# Patient Record
Sex: Female | Born: 1935 | Race: Black or African American | Hispanic: No | Smoking: Former smoker
Health system: Southern US, Community
[De-identification: ages and names within clinical notes are randomized; demographics above are authoritative.]

## PROBLEM LIST (undated history)

## (undated) DIAGNOSIS — I25810 Atherosclerosis of coronary artery bypass graft(s) without angina pectoris: Secondary | ICD-10-CM

## (undated) DIAGNOSIS — I359 Nonrheumatic aortic valve disorder, unspecified: Secondary | ICD-10-CM

## (undated) DIAGNOSIS — M1990 Unspecified osteoarthritis, unspecified site: Secondary | ICD-10-CM

## (undated) DIAGNOSIS — I10 Essential (primary) hypertension: Secondary | ICD-10-CM

## (undated) DIAGNOSIS — E785 Hyperlipidemia, unspecified: Secondary | ICD-10-CM

## (undated) DIAGNOSIS — J449 Chronic obstructive pulmonary disease, unspecified: Secondary | ICD-10-CM

## (undated) DIAGNOSIS — E119 Type 2 diabetes mellitus without complications: Secondary | ICD-10-CM

## (undated) HISTORY — PX: CORONARY ARTERY BYPASS GRAFT: SHX141

## (undated) HISTORY — DX: Atherosclerosis of coronary artery bypass graft(s) without angina pectoris: I25.810

## (undated) HISTORY — DX: Nonrheumatic aortic valve disorder, unspecified: I35.9

## (undated) HISTORY — DX: Chronic obstructive pulmonary disease, unspecified: J44.9

## (undated) HISTORY — DX: Essential (primary) hypertension: I10

## (undated) HISTORY — DX: Unspecified osteoarthritis, unspecified site: M19.90

## (undated) HISTORY — DX: Type 2 diabetes mellitus without complications: E11.9

## (undated) HISTORY — DX: Hyperlipidemia, unspecified: E78.5

---

## 1998-03-10 ENCOUNTER — Emergency Department (HOSPITAL_COMMUNITY): Admission: EM | Admit: 1998-03-10 | Discharge: 1998-03-10 | Payer: Self-pay | Admitting: Emergency Medicine

## 1998-03-10 ENCOUNTER — Encounter: Payer: Self-pay | Admitting: Emergency Medicine

## 1999-04-07 ENCOUNTER — Ambulatory Visit (HOSPITAL_COMMUNITY): Admission: RE | Admit: 1999-04-07 | Discharge: 1999-04-07 | Payer: Self-pay | Admitting: *Deleted

## 1999-04-07 ENCOUNTER — Encounter: Payer: Self-pay | Admitting: *Deleted

## 1999-04-09 ENCOUNTER — Inpatient Hospital Stay (HOSPITAL_COMMUNITY): Admission: AD | Admit: 1999-04-09 | Discharge: 1999-04-12 | Payer: Self-pay | Admitting: *Deleted

## 1999-04-10 ENCOUNTER — Encounter: Payer: Self-pay | Admitting: *Deleted

## 1999-10-08 ENCOUNTER — Encounter: Admission: RE | Admit: 1999-10-08 | Discharge: 2000-01-06 | Payer: Self-pay

## 1999-10-16 ENCOUNTER — Other Ambulatory Visit: Admission: RE | Admit: 1999-10-16 | Discharge: 1999-10-16 | Payer: Self-pay | Admitting: Obstetrics

## 2000-06-22 ENCOUNTER — Encounter: Payer: Self-pay | Admitting: *Deleted

## 2000-06-22 ENCOUNTER — Ambulatory Visit (HOSPITAL_COMMUNITY): Admission: RE | Admit: 2000-06-22 | Discharge: 2000-06-22 | Payer: Self-pay | Admitting: *Deleted

## 2000-08-06 ENCOUNTER — Ambulatory Visit (HOSPITAL_COMMUNITY): Admission: RE | Admit: 2000-08-06 | Discharge: 2000-08-06 | Payer: Self-pay | Admitting: Cardiology

## 2000-08-16 ENCOUNTER — Encounter: Payer: Self-pay | Admitting: Cardiology

## 2000-08-16 ENCOUNTER — Ambulatory Visit (HOSPITAL_COMMUNITY): Admission: RE | Admit: 2000-08-16 | Discharge: 2000-08-16 | Payer: Self-pay | Admitting: Cardiology

## 2000-08-25 ENCOUNTER — Ambulatory Visit (HOSPITAL_COMMUNITY): Admission: RE | Admit: 2000-08-25 | Discharge: 2000-08-25 | Payer: Self-pay | Admitting: *Deleted

## 2000-08-25 ENCOUNTER — Encounter: Payer: Self-pay | Admitting: *Deleted

## 2000-08-26 ENCOUNTER — Encounter: Payer: Self-pay | Admitting: *Deleted

## 2000-08-26 ENCOUNTER — Ambulatory Visit (HOSPITAL_COMMUNITY): Admission: RE | Admit: 2000-08-26 | Discharge: 2000-08-26 | Payer: Self-pay | Admitting: *Deleted

## 2000-09-07 ENCOUNTER — Encounter (HOSPITAL_COMMUNITY): Admission: RE | Admit: 2000-09-07 | Discharge: 2000-12-06 | Payer: Self-pay | Admitting: *Deleted

## 2000-10-05 ENCOUNTER — Ambulatory Visit (HOSPITAL_COMMUNITY): Admission: RE | Admit: 2000-10-05 | Discharge: 2000-10-05 | Payer: Self-pay | Admitting: Cardiology

## 2001-04-20 ENCOUNTER — Encounter: Payer: Self-pay | Admitting: *Deleted

## 2001-04-20 ENCOUNTER — Ambulatory Visit (HOSPITAL_COMMUNITY): Admission: RE | Admit: 2001-04-20 | Discharge: 2001-04-20 | Payer: Self-pay | Admitting: *Deleted

## 2001-04-26 ENCOUNTER — Encounter (HOSPITAL_BASED_OUTPATIENT_CLINIC_OR_DEPARTMENT_OTHER): Admission: RE | Admit: 2001-04-26 | Discharge: 2001-05-02 | Payer: Self-pay | Admitting: Orthopedic Surgery

## 2001-07-27 ENCOUNTER — Encounter (HOSPITAL_COMMUNITY): Admission: RE | Admit: 2001-07-27 | Discharge: 2001-10-25 | Payer: Self-pay | Admitting: *Deleted

## 2002-01-17 ENCOUNTER — Encounter: Admission: RE | Admit: 2002-01-17 | Discharge: 2002-04-17 | Payer: Self-pay | Admitting: Internal Medicine

## 2002-04-24 ENCOUNTER — Ambulatory Visit (HOSPITAL_COMMUNITY): Admission: RE | Admit: 2002-04-24 | Discharge: 2002-04-24 | Payer: Self-pay | Admitting: Orthopedic Surgery

## 2002-04-24 ENCOUNTER — Encounter: Payer: Self-pay | Admitting: Orthopedic Surgery

## 2002-10-04 ENCOUNTER — Encounter: Payer: Self-pay | Admitting: *Deleted

## 2002-10-04 ENCOUNTER — Ambulatory Visit (HOSPITAL_COMMUNITY): Admission: RE | Admit: 2002-10-04 | Discharge: 2002-10-04 | Payer: Self-pay | Admitting: *Deleted

## 2002-10-17 ENCOUNTER — Encounter (HOSPITAL_COMMUNITY): Admission: RE | Admit: 2002-10-17 | Discharge: 2003-01-15 | Payer: Self-pay | Admitting: *Deleted

## 2003-05-04 ENCOUNTER — Ambulatory Visit (HOSPITAL_COMMUNITY): Admission: RE | Admit: 2003-05-04 | Discharge: 2003-05-04 | Payer: Self-pay | Admitting: *Deleted

## 2004-11-19 ENCOUNTER — Ambulatory Visit: Payer: Self-pay | Admitting: *Deleted

## 2004-11-28 ENCOUNTER — Ambulatory Visit: Payer: Self-pay | Admitting: Emergency Medicine

## 2004-12-01 ENCOUNTER — Ambulatory Visit: Payer: Self-pay

## 2004-12-01 ENCOUNTER — Encounter: Payer: Self-pay | Admitting: Internal Medicine

## 2005-01-28 ENCOUNTER — Ambulatory Visit: Payer: Self-pay | Admitting: Emergency Medicine

## 2005-04-06 ENCOUNTER — Emergency Department (HOSPITAL_COMMUNITY): Admission: EM | Admit: 2005-04-06 | Discharge: 2005-04-06 | Payer: Self-pay | Admitting: Emergency Medicine

## 2005-05-07 ENCOUNTER — Emergency Department (HOSPITAL_COMMUNITY): Admission: EM | Admit: 2005-05-07 | Discharge: 2005-05-07 | Payer: Self-pay | Admitting: Emergency Medicine

## 2005-07-16 ENCOUNTER — Ambulatory Visit: Payer: Self-pay | Admitting: *Deleted

## 2005-11-11 ENCOUNTER — Ambulatory Visit: Payer: Self-pay | Admitting: Cardiology

## 2005-11-11 ENCOUNTER — Observation Stay (HOSPITAL_COMMUNITY): Admission: EM | Admit: 2005-11-11 | Discharge: 2005-11-12 | Payer: Self-pay | Admitting: Emergency Medicine

## 2005-11-23 ENCOUNTER — Encounter: Payer: Self-pay | Admitting: Internal Medicine

## 2005-11-23 ENCOUNTER — Ambulatory Visit: Payer: Self-pay

## 2005-11-23 ENCOUNTER — Ambulatory Visit: Payer: Self-pay | Admitting: Internal Medicine

## 2006-03-02 ENCOUNTER — Ambulatory Visit: Payer: Self-pay | Admitting: *Deleted

## 2006-05-04 ENCOUNTER — Ambulatory Visit: Payer: Self-pay

## 2006-07-08 ENCOUNTER — Emergency Department (HOSPITAL_COMMUNITY): Admission: EM | Admit: 2006-07-08 | Discharge: 2006-07-08 | Payer: Self-pay | Admitting: Family Medicine

## 2006-11-09 ENCOUNTER — Ambulatory Visit: Payer: Self-pay | Admitting: Cardiovascular Disease

## 2007-07-18 ENCOUNTER — Ambulatory Visit: Payer: Self-pay | Admitting: Cardiovascular Disease

## 2008-01-24 ENCOUNTER — Emergency Department (HOSPITAL_COMMUNITY): Admission: EM | Admit: 2008-01-24 | Discharge: 2008-01-24 | Payer: Self-pay | Admitting: Emergency Medicine

## 2008-01-31 ENCOUNTER — Ambulatory Visit: Payer: Self-pay | Admitting: Cardiovascular Disease

## 2008-09-29 ENCOUNTER — Emergency Department (HOSPITAL_COMMUNITY): Admission: EM | Admit: 2008-09-29 | Discharge: 2008-09-30 | Payer: Self-pay | Admitting: Emergency Medicine

## 2009-02-27 DIAGNOSIS — J449 Chronic obstructive pulmonary disease, unspecified: Secondary | ICD-10-CM | POA: Insufficient documentation

## 2009-02-27 DIAGNOSIS — E119 Type 2 diabetes mellitus without complications: Secondary | ICD-10-CM | POA: Insufficient documentation

## 2009-02-27 DIAGNOSIS — M1990 Unspecified osteoarthritis, unspecified site: Secondary | ICD-10-CM | POA: Insufficient documentation

## 2009-02-27 DIAGNOSIS — E785 Hyperlipidemia, unspecified: Secondary | ICD-10-CM | POA: Insufficient documentation

## 2009-02-27 DIAGNOSIS — J4489 Other specified chronic obstructive pulmonary disease: Secondary | ICD-10-CM | POA: Insufficient documentation

## 2009-02-27 DIAGNOSIS — I10 Essential (primary) hypertension: Secondary | ICD-10-CM | POA: Insufficient documentation

## 2009-02-27 DIAGNOSIS — I359 Nonrheumatic aortic valve disorder, unspecified: Secondary | ICD-10-CM | POA: Insufficient documentation

## 2009-02-27 DIAGNOSIS — I25810 Atherosclerosis of coronary artery bypass graft(s) without angina pectoris: Secondary | ICD-10-CM

## 2009-04-18 ENCOUNTER — Ambulatory Visit: Payer: Self-pay | Admitting: Cardiovascular Disease

## 2009-06-28 ENCOUNTER — Inpatient Hospital Stay (HOSPITAL_COMMUNITY): Admission: EM | Admit: 2009-06-28 | Discharge: 2009-07-03 | Payer: Self-pay | Admitting: Cardiology

## 2009-06-28 ENCOUNTER — Ambulatory Visit: Payer: Self-pay | Admitting: Cardiology

## 2009-07-01 ENCOUNTER — Encounter: Payer: Self-pay | Admitting: Cardiology

## 2009-07-02 ENCOUNTER — Encounter: Payer: Self-pay | Admitting: Cardiology

## 2009-07-04 ENCOUNTER — Encounter: Payer: Self-pay | Admitting: Internal Medicine

## 2009-07-04 ENCOUNTER — Telehealth: Payer: Self-pay | Admitting: Cardiovascular Disease

## 2009-07-15 ENCOUNTER — Encounter: Payer: Self-pay | Admitting: Cardiology

## 2009-07-15 ENCOUNTER — Ambulatory Visit: Payer: Self-pay

## 2009-07-25 ENCOUNTER — Ambulatory Visit: Payer: Self-pay | Admitting: Internal Medicine

## 2009-07-25 DIAGNOSIS — I442 Atrioventricular block, complete: Secondary | ICD-10-CM

## 2009-07-25 DIAGNOSIS — I5032 Chronic diastolic (congestive) heart failure: Secondary | ICD-10-CM

## 2009-07-25 DIAGNOSIS — N259 Disorder resulting from impaired renal tubular function, unspecified: Secondary | ICD-10-CM | POA: Insufficient documentation

## 2009-07-26 LAB — CONVERTED CEMR LAB
Chloride: 104 meq/L (ref 96–112)
Creatinine, Ser: 2.1 mg/dL — ABNORMAL HIGH (ref 0.4–1.2)
Glucose, Bld: 142 mg/dL — ABNORMAL HIGH (ref 70–99)
Sodium: 142 meq/L (ref 135–145)

## 2009-10-07 ENCOUNTER — Ambulatory Visit: Payer: Self-pay | Admitting: Cardiovascular Disease

## 2009-10-18 ENCOUNTER — Ambulatory Visit: Payer: Self-pay | Admitting: Cardiology

## 2009-10-18 DIAGNOSIS — Z950 Presence of cardiac pacemaker: Secondary | ICD-10-CM | POA: Insufficient documentation

## 2009-11-15 ENCOUNTER — Encounter (INDEPENDENT_AMBULATORY_CARE_PROVIDER_SITE_OTHER): Payer: Self-pay | Admitting: *Deleted

## 2009-12-30 ENCOUNTER — Telehealth: Payer: Self-pay | Admitting: Cardiology

## 2009-12-30 ENCOUNTER — Observation Stay (HOSPITAL_COMMUNITY)
Admission: EM | Admit: 2009-12-30 | Discharge: 2009-12-31 | Payer: Self-pay | Source: Home / Self Care | Attending: Cardiology | Admitting: Cardiology

## 2010-01-09 ENCOUNTER — Telehealth (INDEPENDENT_AMBULATORY_CARE_PROVIDER_SITE_OTHER): Payer: Self-pay | Admitting: Radiology

## 2010-01-14 ENCOUNTER — Ambulatory Visit: Payer: Self-pay

## 2010-01-14 ENCOUNTER — Ambulatory Visit (HOSPITAL_COMMUNITY): Admission: RE | Admit: 2010-01-14 | Payer: Self-pay | Source: Home / Self Care | Admitting: Cardiovascular Disease

## 2010-01-14 ENCOUNTER — Encounter (HOSPITAL_COMMUNITY): Admission: RE | Admit: 2010-01-14 | Payer: Self-pay | Source: Home / Self Care | Admitting: Obstetrics and Gynecology

## 2010-01-22 ENCOUNTER — Telehealth (INDEPENDENT_AMBULATORY_CARE_PROVIDER_SITE_OTHER): Payer: Self-pay

## 2010-01-23 ENCOUNTER — Encounter: Payer: Self-pay | Admitting: Internal Medicine

## 2010-01-23 ENCOUNTER — Ambulatory Visit (HOSPITAL_COMMUNITY)
Admission: RE | Admit: 2010-01-23 | Discharge: 2010-01-23 | Payer: Self-pay | Source: Home / Self Care | Attending: Cardiovascular Disease | Admitting: Cardiovascular Disease

## 2010-01-23 ENCOUNTER — Other Ambulatory Visit: Payer: Self-pay | Admitting: Cardiovascular Disease

## 2010-01-23 ENCOUNTER — Encounter (HOSPITAL_COMMUNITY)
Admission: RE | Admit: 2010-01-23 | Discharge: 2010-02-18 | Payer: Self-pay | Source: Home / Self Care | Attending: Cardiovascular Disease | Admitting: Cardiovascular Disease

## 2010-02-14 ENCOUNTER — Ambulatory Visit
Admission: RE | Admit: 2010-02-14 | Discharge: 2010-02-14 | Payer: Self-pay | Source: Home / Self Care | Attending: Cardiovascular Disease | Admitting: Cardiovascular Disease

## 2010-02-20 NOTE — Assessment & Plan Note (Signed)
Summary: per check out/sf   Visit Type:  3 mo f/u Primary Provider:  Dr Nathanial Rancher  CC:  chest pain at times...sob...denies any edema.  History of Present Illness:  [name] is 77 years old and return for management of her pacemaker and CAD. Her primary cardiologist is Dr. Excell Seltzer and I am seeing her back in pacemaker followup today. In 1998 she had bypass surgery. In June of 2011 she was admitted with weakness and complete heart block and underwent implantation of a DDD pacemaker.  She has done well since that time. She's had no recent chest pain or palpitations. She does have chronic shortness of breath related to her COPD. Her other problems include diabetes, diastolic CHF, and renal insufficiency with creatinines in the range of 2.3. She also has COPD.  She also has echo evidence of a hyperdynamic LV with a subaortic valve gradient of 55 by echo in 2007 and pulmonary hypertension.  Current Medications (verified): 1)  Amlodipine Besylate 10 Mg Tabs (Amlodipine Besylate) .Marland Kitchen.. 1 By Mouth Daily 2)  Protonix 40 Mg Tbec (Pantoprazole Sodium) .Marland Kitchen.. 1 By Mouth Daily 3)  Simvastatin 20 Mg Tabs (Simvastatin) .... Take One Tablet By Mouth Daily At Bedtime 4)  Aspirin 81 Mg  Tabs (Aspirin) .Marland Kitchen.. 1 By Mouth Daily 5)  Nitrostat 0.4 Mg Subl (Nitroglycerin) .Marland Kitchen.. 1 Tablet Under Tongue At Onset of Chest Pain; You May Repeat Every 5 Minutes For Up To 3 Doses. 6)  Novolog 100 Unit/ml Soln (Insulin Aspart) .... As Directed 7)  Ferrous Sulfate 325 (65 Fe) Mg  Tabs (Ferrous Sulfate) .Marland Kitchen.. 1 Tab By Mouth Two Times A Day 8)  Furosemide 20 Mg Tabs (Furosemide) .... Take 1 Tablet By Mouth Once Daily 9)  Metoprolol Tartrate 25 Mg Tabs (Metoprolol Tartrate) .... Take 1 Tablet By Mouth Two Times A Day 10)  Ventolin Hfa 108 (90 Base) Mcg/act Aers (Albuterol Sulfate) .Marland Kitchen.. 1 Puff Q 4 Hours As Needed  Allergies: 1)  ! Penicillin  Past History:  Past Medical History: Reviewed history from 02/27/2009 and no changes  required. Current Problems:  CAD, AUTOLOGOUS BYPASS GRAFT (ICD-414.02) AORTIC STENOSIS, MILD (ICD-424.1)- with subvalvular outflow tract obstruction HYPERTENSION, UNSPECIFIED (ICD-401.9) DYSLIPIDEMIA (ICD-272.4) DIABETES MELLITUS, TYPE II (ICD-250.00) DEGENERATIVE JOINT DISEASE (ICD-715.90) COPD (ICD-496)  Review of Systems       ROS is negative except as outlined in HPI.   Vital Signs:  Patient profile:   77 year old female Height:      60 inches Weight:      138 pounds BMI:     27.05 Pulse rate:   87 / minute Pulse rhythm:   regular BP sitting:   156 / 62  (left arm) Cuff size:   regular  Vitals Entered By: Danielle Rankin, CMA (October 18, 2009 10:45 AM)  Physical Exam  Additional Exam:  Gen. Well-nourished, in no distress   Neck: No JVD, thyroid not enlarged, no carotid bruits Lungs: No tachypnea, clear without rales, rhonchi or wheezes Cardiovascular: Rhythm regular, PMI not displaced,  heart sounds  normal, grade 2/6 systolic ejection murmur at the left sternal edge, no peripheral edema, pulses normal in all 4 extremities. Abdomen: BS normal, abdomen soft and non-tender without masses or organomegaly, no hepatosplenomegaly. MS: No deformities, no cyanosis or clubbing   Neuro:  No focal sns   Skin:  no lesions    PPM Specifications Following MD:  Everardo Beals. Juanda Chance, MD     PPM Vendor:  Medtronic  PPM Model Number:  [id number]     PPM Serial Number:  [id number] PPM DOI:  07/03/2009     PPM Implanting MD:  Lewayne Bunting, MD  Lead 1    Location: RA     DOI: 07/03/2009     Model #: [id number]     Serial #: [id number]     Status: active Lead 2    Location: RV     DOI: 07/03/2009     Model #: [id number]     Serial #: [id number]     Status: active  Magnet Response Rate:  BOL 85 ERI  65  Indications:  CHB   PPM Follow Up Battery Voltage:  2.80 V     Battery Est. Longevity:  9 yrs       PPM Device Measurements Atrium  Amplitude: 4.00 mV, Impedance: 524 ohms, Threshold: 1.00 V at 0.40  msec Right Ventricle  Amplitude: 11.20 mV, Impedance: 589 ohms, Threshold: 0.50 V at 0.40 msec  Episodes MS Episodes:  0     Percent Mode Switch:  0%     Ventricular High Rate:  1     Atrial Pacing:  3.8%     Ventricular Pacing:  99.9%  Parameters Mode:  DDDR     Lower Rate Limit:  60     Upper Rate Limit:  130 Paced AV Delay:  150     Sensed AV Delay:  120 Next Cardiology Appt Due:  06/23/2010 Tech Comments:  1 VHR EPISODE LASTING 6 SECONDS.  NORMAL DEVICE FUNCTION.  CHANGED RA OUTPUT FROM 3.5 TO 2.00 AND RV OUTPUT FROM 3.5 TO 2.50 V. ROV IN JUNE 2012 W/GT. Vella Kohler  October 18, 2009 11:29 AM  Impression & Recommendations:  Problem # 1:  PACEMAKER (ICD-V45.Marland Kitchen01) She had a DDD pacemaker implanted in June for complete AV block. We will interrogate her pacemaker today. We'll arrange followup in pacemaker clinic with Dr. Ladona Ridgel in one year.  Problem # 2:  CAD, AUTOLOGOUS BYPASS GRAFT (ICD-414.02)  She had previous bypass surgery. She's had no recent chest pain and this problem appears stable. We'll arrange followup with Dr. Excell Seltzer who is her primary cardiologist in 6 months. Her updated medication list for this problem includes:    Amlodipine Besylate 10 Mg Tabs (Amlodipine besylate) .Marland Kitchen... 1 by mouth daily    Aspirin 81 Mg Tabs (Aspirin) .Marland Kitchen... 1 by mouth daily    Nitrostat 0.4 Mg Subl (Nitroglycerin) .Marland Kitchen... 1 tablet under tongue at onset of chest pain; you may repeat every 5 minutes for up to 3 doses.    Metoprolol Tartrate 25 Mg Tabs (Metoprolol tartrate) .Marland Kitchen... Take 1 tablet by mouth two times a day  Orders: EKG w/ Interpretation (93000)  Problem # 3:  HOCM / IHSS (ICD-425.1) in 2007 she had an echocardiogram which showed mild LVH and cavity obliteration with a subvalvular gradient of about 55 mmHg and pulmonary hypertension with an estimated systolic pressure of 68 mm. She appears euvolemic today and is probably appears stable. Her updated medication list for this problem  includes:    Amlodipine Besylate 10 Mg Tabs (Amlodipine besylate) .Marland Kitchen... 1 by mouth daily    Aspirin 81 Mg Tabs (Aspirin) .Marland Kitchen... 1 by mouth daily    Nitrostat 0.4 Mg Subl (Nitroglycerin) .Marland Kitchen... 1 tablet under tongue at onset of chest pain; you may repeat every 5 minutes for up to 3 doses.    Furosemide 20 Mg Tabs (Furosemide) .Marland Kitchen... Take 1 tablet by mouth once daily  Metoprolol Tartrate 25 Mg Tabs (Metoprolol tartrate) .Marland Kitchen... Take 1 tablet by mouth two times a day  Patient Instructions: 1)  Your physician recommends that you schedule a follow-up appointment in: 6 months with Dr. Excell Seltzer   1 year with Dr.  Ladona Ridgel 2)  Your physician recommends that you continue on your current medications as directed. Please refer to the Current Medication list given to you today. Prescriptions: NITROSTAT 0.4 MG SUBL (NITROGLYCERIN) 1 tablet under tongue at onset of chest pain; you may repeat every 5 minutes for up to 3 doses.  #25 x 6   Entered by:   Danielle Rankin, CMA   Authorized by:   Lenoria Farrier, MD, [facility]   Signed by:   Danielle Rankin, CMA on 10/18/2009   Method used:   Faxed to ...       [facility] (retail)       [address]       [place], [place]  [address]       Ph: [phone] or [phone]       Fax: [phone]   RxID:   [id number]

## 2010-02-20 NOTE — Assessment & Plan Note (Signed)
Summary: eph/jml   Visit Type:  Follow-up  CC:  no complaints.  History of Present Illness: This is an 77 year old African American female patient who went to the hospital with some progressive weakness and was found to be in complete heart block with a ventricular rate in the 40s a right bundle branch block. She was also noted to have acute on chronic renal failure with a creatinine of 2.8.  The patient underwent successful implantation of a Medtronic dual-chamber pacemaker July 01, 2009. She also had diastolic heart failure and was diuresed. Reviewing an echo from 2007 showed a hyperdynamic LV function with cavity obliteration of 55 mm subvalvular gradient. She was placed on beta blockers. Creatinine was down to 2.29 at discharge.  Patient denies chest pain, palpitations, dyspnea at rest, orthopnea, dizziness, or presyncope. She does have asthma and chronic dyspnea on exertion which is treated with inhalers.  Current Medications (verified): 1)  Amlodipine Besylate 10 Mg Tabs (Amlodipine Besylate) .Marland Kitchen.. 1 By Mouth Daily 2)  Protonix 40 Mg Tbec (Pantoprazole Sodium) .Marland Kitchen.. 1 By Mouth Daily 3)  Simvastatin 20 Mg Tabs (Simvastatin) .... Take One Tablet By Mouth Daily At Bedtime 4)  Aspirin 81 Mg  Tabs (Aspirin) .Marland Kitchen.. 1 By Mouth Daily 5)  Nitrostat 0.4 Mg Subl (Nitroglycerin) .Marland Kitchen.. 1 By Mouth Daily 6)  Novolog 100 Unit/ml Soln (Insulin Aspart) .... As Directed 7)  Tandem Plus 162-115.2-1 Mg Caps (Fefum-Fepo-Fa-B Cmp-C-Zn-Mn-Cu) .... Take 1 Capsule By Mouth Daily 8)  Furosemide 20 Mg Tabs (Furosemide) .... Take 1 Tablet By Mouth Once Daily 9)  Metoprolol Tartrate 25 Mg Tabs (Metoprolol Tartrate) .... Take 1 Tablet By Mouth Two Times A Day 10)  Albuterol Inhaler .Marland Kitchen.. 1 Puff Every 6 Hrs As Needed  Allergies: 1)  ! Penicillin  Past History:  Past Medical History: Last updated: 02/27/2009 Current Problems:  CAD, AUTOLOGOUS BYPASS GRAFT (ICD-414.02) AORTIC STENOSIS, MILD (ICD-424.1)- with  subvalvular outflow tract obstruction HYPERTENSION, UNSPECIFIED (ICD-401.9) DYSLIPIDEMIA (ICD-272.4) DIABETES MELLITUS, TYPE II (ICD-250.00) DEGENERATIVE JOINT DISEASE (ICD-715.90) COPD (ICD-496)  Review of Systems       see history of present illness  Vital Signs:  Patient profile:   77 year old female Height:      60 inches Weight:      138 pounds BMI:     27.05 Pulse rate:   88 / minute Pulse rhythm:   regular BP sitting:   162 / 82  (right arm)  Vitals Entered By: Jacquelin Hawking, CMA (July 25, 2009 9:48 AM)  Serial Vital Signs/Assessments:  Time      Position  BP       Pulse  Resp  Temp     By                     130/70                         Marletta Lor, PA-C   Physical Exam  General:   Well-nournished, in no acute distress. Neck: No JVD, HJR, Bruit, or thyroid enlargement Lungs: No tachypnea, clear without wheezing, rales, or rhonchi Cardiovascular: Pacer site healing well no hematoma or hemorrhage,RRR, PMI not displaced, heart sounds normal,2/6 systolic murmur at the left sternal border,no gallops, bruit, thrill, or heave. Abdomen: BS normal. Soft without organomegaly, masses, lesions or tenderness. Extremities: without cyanosis, clubbing or edema. Good distal pulses bilateral SKin: Warm, no lesions or rashes  Musculoskeletal: No deformities Neuro: no  focal signs    PPM Specifications Following MD:  Everardo Beals. Juanda Chance, MD     PPM Vendor:  Medtronic     PPM Model Number:  [id number]     PPM Serial Number:  [id number] PPM DOI:  07/03/2009     PPM Implanting MD:  Lewayne Bunting, MD  Lead 1    Location: RA     DOI: 07/03/2009     Model #: [id number]     Serial #: [id number]     Status: active Lead 2    Location: RV     DOI: 07/03/2009     Model #: [id number]     Serial #: [id number]     Status: active  Magnet Response Rate:  BOL 85 ERI  65  Indications:  CHB   Parameters Mode:  DDDR     Lower Rate Limit:  60     Upper Rate Limit:  130 Paced AV Delay:  150      Sensed AV Delay:  120  Impression & Recommendations:  Problem # 1:  AV BLOCK, COMPLETE (ICD-426.0) Patient presented to the hospital and complete heart block and underwent Medtronic permanent pacer placement July 01, 2009. She is doing well without complaints. Her updated medication list for this problem includes:    Amlodipine Besylate 10 Mg Tabs (Amlodipine besylate) .Marland Kitchen... 1 by mouth daily    Aspirin 81 Mg Tabs (Aspirin) .Marland Kitchen... 1 by mouth daily    Nitrostat 0.4 Mg Subl (Nitroglycerin) .Marland Kitchen... 1 by mouth daily    Metoprolol Tartrate 25 Mg Tabs (Metoprolol tartrate) .Marland Kitchen... Take 1 tablet by mouth two times a day  Problem # 2:  DIASTOLIC HEART FAILURE, CHRONIC (ICD-428.32) Patient had acute on chronic heart failure in the hospital and diuresed easily. Last 2-D echo was in 2007. We may want to repeat this in the future. Her updated medication list for this problem includes:    Amlodipine Besylate 10 Mg Tabs (Amlodipine besylate) .Marland Kitchen... 1 by mouth daily    Aspirin 81 Mg Tabs (Aspirin) .Marland Kitchen... 1 by mouth daily    Nitrostat 0.4 Mg Subl (Nitroglycerin) .Marland Kitchen... 1 by mouth daily    Furosemide 20 Mg Tabs (Furosemide) .Marland Kitchen... Take 1 tablet by mouth once daily    Metoprolol Tartrate 25 Mg Tabs (Metoprolol tartrate) .Marland Kitchen... Take 1 tablet by mouth two times a day  Problem # 3:  RENAL INSUFFICIENCY (ICD-588.9) Patient had acute on chronic renal failure in the hospital. Creatinine went up to 2.8. It was 2.29 at discharge. We will repeat today.  Problem # 4:  HYPERTENSION, UNSPECIFIED (ICD-401.9) Patient has a history of white coat syndrome. Her blood pressure came down nicely on repeat check. Her updated medication list for this problem includes:    Amlodipine Besylate 10 Mg Tabs (Amlodipine besylate) .Marland Kitchen... 1 by mouth daily    Aspirin 81 Mg Tabs (Aspirin) .Marland Kitchen... 1 by mouth daily    Furosemide 20 Mg Tabs (Furosemide) .Marland Kitchen... Take 1 tablet by mouth once daily    Metoprolol Tartrate 25 Mg Tabs (Metoprolol tartrate) .Marland Kitchen...  Take 1 tablet by mouth two times a day  Orders: TLB-BMP (Basic Metabolic Panel-BMET) (80048-METABOL)  Patient Instructions: 1)  Your physician recommends that you schedule a follow-up appointment in: 1 to 2 months with Dr. Excell Seltzer 2)  Your physician recommends that you return for lab work in: BMET today.

## 2010-02-20 NOTE — Miscellaneous (Signed)
Summary: dx code correction  Clinical Lists Changes  Problems: Changed problem from PACEMAKER (ICD-V45.Marland Kitchen01) to PACEMAKER, PERMANENT (ICD-V45.01)  changed the incorrect dx code to correct dx code Genella Mech  November 15, 2009 11:00 AM

## 2010-02-20 NOTE — Progress Notes (Signed)
Summary: chest pain  Phone Note Call from Patient Call back at Home Phone [phone]   Caller: Patient Reason for Call: Talk to Nurse Summary of Call: pt has chest pain. pt states she has taken meds but it has not help.  Initial call taken by: Roe Coombs,  December 30, 2009 8:35 AM  Follow-up for Phone Call        12/30/09--0830am--pt's care giver(MS TENNANT)  calling stating pt is having chest pain across left chest--unable to give me pain scale reading, but has taken 2 Ntg without relief--advised to call 911 and proceed to [place]--caregiver agrees--attempted to notify trish and paperwork faxed to [place]-- Follow-up by: Ledon Snare, RN,  December 30, 2009 8:41 AM     Appended Document: chest pain Dr. Excell Seltzer is this pt's primary cardiologist. Dr. Juanda Chance sees for PPM.

## 2010-02-20 NOTE — Assessment & Plan Note (Signed)
Summary: 2 month rov   Visit Type:  Follow-up Primary Provider:  Dr Nathanial Rancher  CC:  sob.  History of Present Illness: This is an 77 year old African American female patient who was hospitalized earlier this year with progressive weakness and was found to be in complete heart block with a ventricular rate in the 40s a right bundle branch block. She was also noted to have acute on chronic renal failure with a creatinine of 2.8.  The patient underwent successful implantation of a Medtronic dual-chamber pacemaker July 01, 2009. She also had diastolic heart failure and was diuresed. Reviewing an echo from 2007 showed a hyperdynamic LV function with cavity obliteration of 55 mm subvalvular gradient. She was placed on beta blockers. Creatinine was down to 2.29 at discharge.  She has chronic dyspnea unchanged over several years. No orthopnea, PND, or edema. No chest pain. Denies lightheadedness or syncope.  Current Medications (verified): 1)  Amlodipine Besylate 10 Mg Tabs (Amlodipine Besylate) .Marland Kitchen.. 1 By Mouth Daily 2)  Protonix 40 Mg Tbec (Pantoprazole Sodium) .Marland Kitchen.. 1 By Mouth Daily 3)  Simvastatin 20 Mg Tabs (Simvastatin) .... Take One Tablet By Mouth Daily At Bedtime 4)  Aspirin 81 Mg  Tabs (Aspirin) .Marland Kitchen.. 1 By Mouth Daily 5)  Nitrostat 0.4 Mg Subl (Nitroglycerin) .Marland Kitchen.. 1 By Mouth Daily 6)  Novolog 100 Unit/ml Soln (Insulin Aspart) .... As Directed 7)  Ferrous Sulfate 325 (65 Fe) Mg  Tabs (Ferrous Sulfate) .Marland Kitchen.. 1 Tab By Mouth Two Times A Day 8)  Furosemide 20 Mg Tabs (Furosemide) .... Take 1 Tablet By Mouth Once Daily 9)  Metoprolol Tartrate 25 Mg Tabs (Metoprolol Tartrate) .... Take 1 Tablet By Mouth Two Times A Day 10)  Albuterol Inhaler .Marland Kitchen.. 1 Puff Every 6 Hrs As Needed  Allergies: 1)  ! Penicillin  Past History:  Past medical history reviewed for relevance to current acute and chronic problems.  Past Medical History: Reviewed history from 02/27/2009 and no changes required. Current  Problems:  CAD, AUTOLOGOUS BYPASS GRAFT (ICD-414.02) AORTIC STENOSIS, MILD (ICD-424.1)- with subvalvular outflow tract obstruction HYPERTENSION, UNSPECIFIED (ICD-401.9) DYSLIPIDEMIA (ICD-272.4) DIABETES MELLITUS, TYPE II (ICD-250.00) DEGENERATIVE JOINT DISEASE (ICD-715.90) COPD (ICD-496)  Review of Systems       Negative except as per HPI   Vital Signs:  Patient profile:   77 year old female Height:      60 inches Weight:      139 pounds Pulse rate:   89 / minute Resp:     20 per minute BP sitting:   153 / 74  (left arm)  Vitals Entered By: Kem Parkinson (October 07, 2009 10:22 AM)  Serial Vital Signs/Assessments:  Time      Position  BP       Pulse  Resp  Temp     By           R Arm     153/74                         Kimalexis Barnes           L Arm     150/69                         Kimalexis Barnes   Physical Exam  General:  Pt is alert and oriented, elderly woman in no acute distress. HEENT: normal Neck: normal carotid upstrokes without bruits, JVP normal Lungs: CTA CV: RRR with  3/6 systolic murmur along LSB Abd: soft, NT, positive BS, no bruit, no organomegaly Ext: no clubbing, cyanosis, or edema. peripheral pulses 2+ and equal Skin: warm and dry without rash    PPM Specifications Following MD:  Everardo Beals. Juanda Chance, MD     PPM Vendor:  Medtronic     PPM Model Number:  [id number]     PPM Serial Number:  [id number] PPM DOI:  07/03/2009     PPM Implanting MD:  Lewayne Bunting, MD  Lead 1    Location: RA     DOI: 07/03/2009     Model #: 9147     Serial #: [id number]     Status: active Lead 2    Location: RV     DOI: 07/03/2009     Model #: [id number]     Serial #: [id number]     Status: active  Magnet Response Rate:  BOL 85 ERI  65  Indications:  CHB   Parameters Mode:  DDDR     Lower Rate Limit:  60     Upper Rate Limit:  130 Paced AV Delay:  150     Sensed AV Delay:  120  Impression & Recommendations:  Problem # 1:  AV BLOCK, COMPLETE (ICD-426.0) Now s/p  permanent pacemaker with resolution of weakness.  Her updated medication list for this problem includes:    Amlodipine Besylate 10 Mg Tabs (Amlodipine besylate) .Marland Kitchen... 1 by mouth daily    Aspirin 81 Mg Tabs (Aspirin) .Marland Kitchen... 1 by mouth daily    Nitrostat 0.4 Mg Subl (Nitroglycerin) .Marland Kitchen... 1 by mouth daily    Metoprolol Tartrate 25 Mg Tabs (Metoprolol tartrate) .Marland Kitchen... Take 1 tablet by mouth two times a day  Problem # 2:  CAD, AUTOLOGOUS BYPASS GRAFT (ICD-414.02) Stable without angina. Continue current medical therapy.  Her updated medication list for this problem includes:    Amlodipine Besylate 10 Mg Tabs (Amlodipine besylate) .Marland Kitchen... 1 by mouth daily    Aspirin 81 Mg Tabs (Aspirin) .Marland Kitchen... 1 by mouth daily    Nitrostat 0.4 Mg Subl (Nitroglycerin) .Marland Kitchen... 1 by mouth daily    Metoprolol Tartrate 25 Mg Tabs (Metoprolol tartrate) .Marland Kitchen... Take 1 tablet by mouth two times a day  Problem # 3:  DIASTOLIC HEART FAILURE, CHRONIC (ICD-428.32) Volume status is stable. Exertional dyspnea unchanged over many years. Appears euvolemic on exam.  Her updated medication list for this problem includes:    Amlodipine Besylate 10 Mg Tabs (Amlodipine besylate) .Marland Kitchen... 1 by mouth daily    Aspirin 81 Mg Tabs (Aspirin) .Marland Kitchen... 1 by mouth daily    Nitrostat 0.4 Mg Subl (Nitroglycerin) .Marland Kitchen... 1 by mouth daily    Furosemide 20 Mg Tabs (Furosemide) .Marland Kitchen... Take 1 tablet by mouth once daily    Metoprolol Tartrate 25 Mg Tabs (Metoprolol tartrate) .Marland Kitchen... Take 1 tablet by mouth two times a day  Patient Instructions: 1)  Your physician recommends that you continue on your current medications as directed. Please refer to the Current Medication list given to you today. 2)  Your physician wants you to follow-up in:  6 MONTHS.  You will receive a reminder letter in the mail two months in advance. If you don't receive a letter, please call our office to schedule the follow-up appointment.

## 2010-02-20 NOTE — Assessment & Plan Note (Signed)
Summary: Cardiology Nuclear Testing**echo**  Nuclear Med Background Indications for Stress Test: Evaluation for Ischemia, Graft Patency, Post Hospital  Indications Comments: 12/30/09 chest pain, (-) enzymes  History: CABG, COPD, Echo, Emphysema, Heart Catheterization, Myocardial Perfusion Study, Pacemaker  History Comments: '98 CABG; '02 Cath:patent grafts; 11/07 Echo:EF=75-80%,  mild AS;  Symptoms: Chest Pain, DOE, SOB  Symptoms Comments: Last episode of ZO:XWRU since d/c.   Nuclear Pre-Procedure Cardiac Risk Factors: History of Smoking, Hypertension, IDDM Type 2, RBBB Caffeine/Decaff Intake: None NPO After: 6:00 PM Lungs: Albuterol inhaler used at check-in, 11:45 a.m.  Lungs are clear.  O2 Sats 94% on RA. IV 0.9% NS with Angio Cath: 22g     IV Site: R Hand IV Started by: Bonnita Levan, RN Chest Size (in) 36     Cup Size D     Height (in): 60 Weight (lb): 136 BMI: 26.66 Tech Comments: Lopressor held this a.m.  Neb tx. this a.m.  Nuclear Med Study 1 or 2 day study:  1 day     Stress Test Type:  Eugenie Birks Reading MD:  Dietrich Pates, MD     Referring MD:  Tonny Bollman, MD Resting Radionuclide:  Technetium 64m Tetrofosmin     Resting Radionuclide Dose:  11 mCi  Stress Radionuclide:  Technetium 43m Tetrofosmin     Stress Radionuclide Dose:  33 mCi   Stress Protocol   Lexiscan: 0.4 mg   Stress Test Technologist:  Rea [employer], CMA-N     Nuclear Technologist:  Doyne Keel, CNMT  Rest Procedure  Myocardial perfusion imaging was performed at rest 45 minutes following the intravenous administration of Technetium 52m Tetrofosmin.  Stress Procedure  The patient received IV Lexiscan 0.4 mg over 15-seconds.  Technetium 23m Tetrofosmin injected at 30-seconds.  There were no significant changes with infusion.  She did c/o chest tightness.  Quantitative spect images were obtained after a 45 minute delay.  QPS Raw Data Images:  Images were motion corrected.  SOft tissue (diaphragm)  underlies heart. Stress Images:  Normal homogeneous uptake in all areas of the myocardium. Rest Images:  Normal homogeneous uptake in all areas of the myocardium. Subtraction (SDS):  No evidence of ischemia. Transient Ischemic Dilatation:  1.04  (Normal <1.22)  Lung/Heart Ratio:  .28  (Normal <0.45)  Quantitative Gated Spect Images QGS EDV:  48 ml QGS ESV:  14 ml QGS EF:  71 %   Overall Impression  Exercise Capacity: Lexiscan with no exercise. BP Response: Normal blood pressure response. Clinical Symptoms: No chest pain ECG Impression: No significant ST segment change suggestive of ischemia. Overall Impression: Normal stress nuclear study.  Appended Document: Cardiology Nuclear Testing**echo** PT AWARE./CY

## 2010-02-20 NOTE — Progress Notes (Signed)
Summary: nuc pre-procedure  Phone Note Outgoing Call   Call placed by: Domenic Polite, CNMT,  January 09, 2010 2:33 PM Call placed to: Patient Reason for Call: Confirm/change Appt Summary of Call: Reviewed information on Myoview Information Sheet (see scanned document for further details).  Spoke with patient.      Nuclear Med Background Indications for Stress Test: Evaluation for Ischemia, Graft Patency, Post Hospital  Indications Comments: 12/30/09 Post Hosp Chest pain (-) enzymes  History: CABG, COPD, Echo, Heart Catheterization, Myocardial Perfusion Study, Pacemaker  History Comments: '98 cabg x 4; '02 cath severe 3v dz/ patent grafts; 11/07 Echo EF= 75-80% mild  AS ; 6/11 pacer ; Pulm. HTN / CHF     Nuclear Pre-Procedure Cardiac Risk Factors: History of Smoking, Hypertension Height (in): 60

## 2010-02-20 NOTE — Cardiovascular Report (Signed)
Summary: [employer] Implant Record   [employer] Implant Record   Imported By: Roderic Ovens 07/19/2009 09:35:19  _____________________________________________________________________  External Attachment:    Type:   Image     Comment:   External Document

## 2010-02-20 NOTE — Progress Notes (Signed)
Summary: Nuc. Pre-Procedure  Phone Note Outgoing Call Call back at [facility] Phone [phone]   Call placed by: Irean Hong, RN,  January 22, 2010 3:42 PM Summary of Call: Reviewed information on Myoview Information Sheet (see scanned document for further details).  Spoke with patient.     Nuclear Med Background Indications for Stress Test: Evaluation for Ischemia, Graft Patency, Post Hospital  Indications Comments: 12/30/09 Post Hosp Chest pain (-) enzymes  History: CABG, COPD, Echo, Heart Catheterization, Myocardial Perfusion Study, Pacemaker  History Comments: '98 CABG x 4; '02 cath severe 3v dz/ patent grafts; 11/07 Echo EF= 75-80% mild  AS ; 6/11 PTVP ; Pulm. HTN / CHF     Nuclear Pre-Procedure Cardiac Risk Factors: History of Smoking, Hypertension Height (in): 60

## 2010-02-20 NOTE — Cardiovascular Report (Signed)
Summary: Office Visit  Office Visit   Imported By: Marylou Mccoy 09/13/2009 09:35:10  _____________________________________________________________________  External Attachment:    Type:   Image     Comment:   External Document

## 2010-02-20 NOTE — Procedures (Signed)
Summary: wound check   Current Medications (verified): 1)  Amlodipine Besylate 10 Mg Tabs (Amlodipine Besylate) .Marland Kitchen.. 1 By Mouth Daily 2)  Protonix 40 Mg Tbec (Pantoprazole Sodium) .Marland Kitchen.. 1 By Mouth Daily 3)  Simvastatin 20 Mg Tabs (Simvastatin) .... Take One Tablet By Mouth Daily At Bedtime 4)  Aspirin 81 Mg  Tabs (Aspirin) .Marland Kitchen.. 1 By Mouth Daily 5)  Nitrostat 0.4 Mg Subl (Nitroglycerin) .Marland Kitchen.. 1 By Mouth Daily 6)  Novolog 100 Unit/ml Soln (Insulin Aspart) .... As Directed 7)  Tandem Plus 162-115.2-1 Mg Caps (Fefum-Fepo-Fa-B Cmp-C-Zn-Mn-Cu) .... Take 1 Capsule By Mouth Daily 8)  Furosemide 20 Mg Tabs (Furosemide) .... Take 1 Tablet By Mouth Once Daily 9)  Metoprolol Tartrate 25 Mg Tabs (Metoprolol Tartrate) .... Take 1 Tablet By Mouth Two Times A Day 10)  Albuterol Inhaler .Marland Kitchen.. 1 Puff Every 6 Hrs As Needed  Allergies: 1)  ! Penicillin  PPM Specifications Following MD:  Everardo Beals. Juanda Chance, MD     PPM Vendor:  [employer]     PPM Model Number:  [id number]     PPM Serial Number:  [id number] PPM DOI:  07/03/2009     PPM Implanting MD:  Lewayne Bunting, MD  Lead 1    Location: RA     DOI: 07/03/2009     Model #: [id number]     Serial #: [id number]     Status: active Lead 2    Location: RV     DOI: 07/03/2009     Model #: [id number]     Serial #: [id number]     Status: active  Magnet Response Rate:  BOL 85 ERI  65  Indications:  CHB   PPM Follow Up Battery Voltage:  2.80 V     Battery Est. Longevity:  9.5yrs       PPM Device Measurements Atrium  Amplitude: 5.60 mV, Impedance: 454 ohms, Threshold: 0.750 V at 0.40 msec Right Ventricle  Amplitude: 11.20 mV, Impedance: 678 ohms, Threshold: 0.50 V at 0.40 msec  Episodes MS Episodes:  0     Ventricular High Rate:  0     Atrial Pacing:  0.3%     Ventricular Pacing:  99.9%  Parameters Mode:  DDDR     Lower Rate Limit:  60     Upper Rate Limit:  130 Paced AV Delay:  150     Sensed AV Delay:  120 Next Cardiology Appt Due:  09/19/2009 Tech Comments:  WOUND  CHECK--STERI STRIPS REMOVED.  NO REDNESS OR SWELLING.  NORMAL DEVICE FUNCTION.  RA & RV OUTPUTS AT 3.5.  NO CHANGES MADE.  ROV IN 3 MTHS W/BB. Vella Kohler  July 15, 2009 11:13 AM   Patient Instructions: 1)  Followup appointment on 07-25-09 @ 1015 w/PA.  Next followup with Dr Juanda Chance in 3 mths.  Appointment to be made today.  Steri strips removed today and is ok to wash with soap and water.  No lotions or powder on site area.

## 2010-02-20 NOTE — Miscellaneous (Signed)
Summary: Device preload  Clinical Lists Changes  Observations: Added new observation of PPM INDICATN: CHB (07/04/2009 13:48) Added new observation of MAGNET RTE: BOL 85 ERI  65 (07/04/2009 13:48) Added new observation of PPMLEADSTAT2: active (07/04/2009 13:48) Added new observation of PPMLEADSER2: [id number] (07/04/2009 13:48) Added new observation of PPMLEADMOD2: 5076  (07/04/2009 13:48) Added new observation of PPMLEADLOC2: RV  (07/04/2009 13:48) Added new observation of PPMLEADSTAT1: active  (07/04/2009 13:48) Added new observation of PPMLEADSER1: [id number]  (07/04/2009 13:48) Added new observation of PPMLEADMOD1: 5076  (07/04/2009 13:48) Added new observation of PPMLEADLOC1: RA  (07/04/2009 13:48) Added new observation of PPM IMP MD: Lewayne Bunting, MD  (07/04/2009 13:48) Added new observation of PPMLEADDOI2: 07/03/2009  (07/04/2009 13:48) Added new observation of PPMLEADDOI1: 07/03/2009  (07/04/2009 13:48) Added new observation of PPM DOI: 07/03/2009  (07/04/2009 13:48) Added new observation of PPM SERL#: [id number]  (07/04/2009 13:48) Added new observation of PPM MODL#: [id number]  (07/04/2009 96:29) Added new observation of PACEMAKERMFG: [employer]  (07/04/2009 13:48) Added new observation of PACEMAKER MD: Lewayne Bunting, MD  (07/04/2009 13:48)      PPM Specifications Following MD:  Lewayne Bunting, MD     PPM Vendor:  [employer]     PPM Model Number:  [id number]     PPM Serial Number:  [id number] PPM DOI:  07/03/2009     PPM Implanting MD:  Lewayne Bunting, MD  Lead 1    Location: RA     DOI: 07/03/2009     Model #: [id number]     Serial #: [id number]     Status: active Lead 2    Location: RV     DOI: 07/03/2009     Model #: [id number]     Serial #: [id number]     Status: active  Magnet Response Rate:  BOL 85 ERI  65  Indications:  CHB

## 2010-02-20 NOTE — Assessment & Plan Note (Signed)
Summary: f1y  Medications Added RE DUALVIT PLUS 162-115.2-1 MG CAPS (FEFUM-FEPO-FA-B CMP-C-ZN-MN-CU) Take 1 capsule by mouth once a day LOSARTAN POTASSIUM-HCTZ 100-25 MG TABS (LOSARTAN POTASSIUM-HCTZ) Take 1 tablet by mouth once a day SIMVASTATIN 20 MG TABS (SIMVASTATIN) Take one tablet by mouth daily at bedtime NOVOLOG 100 UNIT/ML SOLN (INSULIN ASPART) as directed        Visit Type:  1 year follow up  CC:  Sob-.  History of Present Illness: Ms. Bas is an 77 year old woman with coronary artery disease and prior coronary artery bypass surgery.  She also has hypertension, dyslipidemia, and mild aortic stenosis with subvalvular outflow tract obstruction.   She presents today for follow-up evaluation. The pt admits to chronic DOE, but denies any recent change in this symptom. Dyspnea occurs with low-levael activity. No chest pain, edema, orthopnea, or PND.  Current Medications (verified): 1)  Amlodipine Besylate 10 Mg Tabs (Amlodipine Besylate) .Marland Kitchen.. 1 By Mouth Daily 2)  Re Dualvit Plus 162-115.2-1 Mg Caps (Fefum-Fepo-Fa-B Cmp-C-Zn-Mn-Cu) .... Take 1 Capsule By Mouth Once A Day 3)  Losartan Potassium-Hctz 100-25 Mg Tabs (Losartan Potassium-Hctz) .... Take 1 Tablet By Mouth Once A Day 4)  Protonix 40 Mg Tbec (Pantoprazole Sodium) .Marland Kitchen.. 1 By Mouth Daily 5)  Simvastatin 20 Mg Tabs (Simvastatin) .... Take One Tablet By Mouth Daily At Bedtime 6)  Aspirin 81 Mg  Tabs (Aspirin) .Marland Kitchen.. 1 By Mouth Daily 7)  Nitrostat 0.4 Mg Subl (Nitroglycerin) .Marland Kitchen.. 1 By Mouth Daily 8)  Novolog 100 Unit/ml Soln (Insulin Aspart) .... As Directed  Allergies: 1)  ! Ace Inhibitors 2)  ! Penicillin  Past History:  Past medical history reviewed for relevance to current acute and chronic problems.  Past Medical History: Reviewed history from 02/27/2009 and no changes required. Current Problems:  CAD, AUTOLOGOUS BYPASS GRAFT (ICD-414.02) AORTIC STENOSIS, MILD (ICD-424.1)- with subvalvular outflow tract  obstruction HYPERTENSION, UNSPECIFIED (ICD-401.9) DYSLIPIDEMIA (ICD-272.4) DIABETES MELLITUS, TYPE II (ICD-250.00) DEGENERATIVE JOINT DISEASE (ICD-715.90) COPD (ICD-496)  Vital Signs:  Patient profile:   77 year old female Height:      60 inches Weight:      143.75 pounds Pulse rate:   95 / minute Pulse rhythm:   regular Resp:     20 per minute BP sitting:   148 / 79  (left arm) Cuff size:   large  Vitals Entered By: Vikki Ports (April 18, 2009 10:44 AM)  Physical Exam  General:  Pt is alert and oriented, elderly, African-American woman, in no acute distress. HEENT: normal Neck: normal carotid upstrokes without bruits, JVP normal Lungs: CTA CV: RRR with 1/6 SEM along LSB Abd: soft, NT, positive BS, no bruit, no organomegaly Ext: no clubbing, cyanosis, or edema. peripheral pulses 2+ and equal Skin: warm and dry without rash    EKG  Procedure date:  04/18/2009  Findings:      NSR with RBBB, LAD, biatrial enlargement, HR 95 bpm.  Impression & Recommendations:  Problem # 1:  CAD, AUTOLOGOUS BYPASS GRAFT (ICD-414.02) Stable without angina. Continue current med Rx as below. Beta blocker was discontinued because of symptomatic bradycardia.   Her updated medication list for this problem includes:    Amlodipine Besylate 10 Mg Tabs (Amlodipine besylate) .Marland Kitchen... 1 by mouth daily    Aspirin 81 Mg Tabs (Aspirin) .Marland Kitchen... 1 by mouth daily    Nitrostat 0.4 Mg Subl (Nitroglycerin) .Marland Kitchen... 1 by mouth daily  Orders: EKG w/ Interpretation (93000)  Problem # 2:  AORTIC STENOSIS, MILD (ICD-424.1) Barely audible on  exam. Continue observation.  Her updated medication list for this problem includes:    Losartan Potassium-hctz 100-25 Mg Tabs (Losartan potassium-hctz) .Marland Kitchen... Take 1 tablet by mouth once a day    Nitrostat 0.4 Mg Subl (Nitroglycerin) .Marland Kitchen... 1 by mouth daily  Orders: EKG w/ Interpretation (93000)  Problem # 3:  HYPERTENSION, UNSPECIFIED (ICD-401.9)  Her updated  medication list for this problem includes:    Amlodipine Besylate 10 Mg Tabs (Amlodipine besylate) .Marland Kitchen... 1 by mouth daily    Losartan Potassium-hctz 100-25 Mg Tabs (Losartan potassium-hctz) .Marland Kitchen... Take 1 tablet by mouth once a day    Aspirin 81 Mg Tabs (Aspirin) .Marland Kitchen... 1 by mouth daily  Orders: EKG w/ Interpretation (93000)  BP today: 148/79  Patient Instructions: 1)  Your physician recommends that you continue on your current medications as directed. Please refer to the Current Medication list given to you today. 2)  Your physician wants you to follow-up in:   1 YEAR. You will receive a reminder letter in the mail two months in advance. If you don't receive a letter, please call our office to schedule the follow-up appointment.

## 2010-02-20 NOTE — Progress Notes (Signed)
Summary: QUESTION ABOUT PHYSICAL THERAPY  Phone Note Call from Patient Call back at Home Phone [phone] Call back at [phone]   Caller: Daughter/JAMIE Summary of Call: PT DAUGHTER CALLING WITH QUESTION ABOUT  PT HAVING A PHYSICAL THERAPY ON HER ARM Initial call taken by: Judie Grieve,  July 04, 2009 9:44 AM  Follow-up for Phone Call        [facility] Lisabeth Devoid RN [facility] Lisabeth Devoid RN  Left message to call back. Julieta Gutting, RN, BSN  July 08, 2009 4:04 PM

## 2010-02-20 NOTE — Assessment & Plan Note (Signed)
Summary: eph   Visit Type:  Post-hospital Primary Provider:  Dr Nathanial Rancher  CC:  Sob.  History of Present Illness:  [name] [name] is 77 years old and returns for followup after recent hospitalization for chest pain. She has a pacemaker and CAD.  In 1998 she had bypass surgery. In June of 2011 she was admitted with weakness and complete heart block and underwent implantation of a DDD pacemaker.  She was hospitalized with chest pain in December but only had a single episode of pain. Cardiac markers were negative and she was discharged home with an outpatient stress test arranged. The Myoview scan showed no ischemia.   She has had no further pain episodes. Reports stable dyspnea with exertion. No edema, lightheadedness, or palpitations. No other complaints.  Current Medications (verified): 1)  Amlodipine Besylate 10 Mg Tabs (Amlodipine Besylate) .Marland Kitchen.. 1 By Mouth Daily 2)  Protonix 40 Mg Tbec (Pantoprazole Sodium) .Marland Kitchen.. 1 By Mouth Daily 3)  Simvastatin 20 Mg Tabs (Simvastatin) .... Take One Tablet By Mouth Daily At Bedtime 4)  Aspirin 81 Mg  Tabs (Aspirin) .Marland Kitchen.. 1 By Mouth Daily 5)  Nitrostat 0.4 Mg Subl (Nitroglycerin) .Marland Kitchen.. 1 Tablet Under Tongue At Onset of Chest Pain; You May Repeat Every 5 Minutes For Up To 3 Doses. 6)  Novolog 100 Unit/ml Soln (Insulin Aspart) .... As Directed 7)  Ferrous Sulfate 325 (65 Fe) Mg  Tabs (Ferrous Sulfate) .Marland Kitchen.. 1 Tab By Mouth Two Times A Day 8)  Furosemide 20 Mg Tabs (Furosemide) .... Take 1 Tablet By Mouth Once Daily 9)  Metoprolol Tartrate 25 Mg Tabs (Metoprolol Tartrate) .... Take 1 Tablet By Mouth Two Times A Day 10)  Ventolin Hfa 108 (90 Base) Mcg/act Aers (Albuterol Sulfate) .Marland Kitchen.. 1 Puff Q 4 Hours As Needed  Allergies: 1)  ! Penicillin 2)  ! Ace Inhibitors  Past History:  Past medical history reviewed for relevance to current acute and chronic problems.  Past Medical History: Reviewed history from 02/27/2009 and no changes required. Current Problems:    CAD, AUTOLOGOUS BYPASS GRAFT (ICD-414.02) AORTIC STENOSIS, MILD (ICD-424.1)- with subvalvular outflow tract obstruction HYPERTENSION, UNSPECIFIED (ICD-401.9) DYSLIPIDEMIA (ICD-272.4) DIABETES MELLITUS, TYPE II (ICD-250.00) DEGENERATIVE JOINT DISEASE (ICD-715.90) COPD (ICD-496)  Review of Systems       Negative except as per HPI   Vital Signs:  Patient profile:   77 year old female Height:      60 inches Weight:      140 pounds BMI:     27.44 Pulse rate:   80 / minute Pulse rhythm:   regular Resp:     20 per minute BP sitting:   136 / 70  (left arm) Cuff size:   large  Vitals Entered By: Vikki Ports (February 14, 2010 9:52 AM)  Physical Exam  General:  Pt is alert and oriented, elderly woman in no acute distress. HEENT: normal Neck: normal carotid upstrokes without bruits, JVP normal Lungs: CTA CV: RRR with 3/6 systolic murmur along LSB Abd: soft, NT, positive BS, no bruit, no organomegaly Ext: no clubbing, cyanosis, or edema. peripheral pulses 2+ and equal Skin: warm and dry without rash    Echocardiogram  Procedure date:  01/23/2010  Findings:      Study Conclusions            - Left ventricle: The cavity size was normal. Wall thickness was       increased in a pattern of moderate LVH. Systolic function was  vigorous. The estimated ejection fraction was in the range of 65%       to 70%. Wall motion was normal; there were no regional wall motion       abnormalities. Doppler parameters are consistent with abnormal       left ventricular relaxation (grade 1 diastolic dysfunction).     - Aortic valve: AV is thickened, calcified. Question functionally       bicuspid. Peak and mean gradients through the valve are 52 and 33       mm Hg respectively consistent with moderate AS. Mild       regurgitation.     - Mitral valve: MV leaflefts are difficult to see well. Peak and       mean gradients through the valve are 30 and 16 mm Hg respectively.       MVA by  Pressure T1/2 is 2.6 cm2. Severe annular calcification.       Mild regurgitation.     - Left atrium: The atrium was moderately dilated.     - Pulmonary arteries: PA peak pressure: 55mm Hg (S).         Nuclear Study  Procedure date:  01/23/2010  Findings:      QPS  Raw Data Images:  Images were motion corrected.  SOft tissue (diaphragm) underlies heart. Stress Images:  Normal homogeneous uptake in all areas of the myocardium. Rest Images:  Normal homogeneous uptake in all areas of the myocardium. Subtraction (SDS):  No evidence of ischemia. Transient Ischemic Dilatation:  1.04  (Normal <1.22)  Lung/Heart Ratio:  .28  (Normal <0.45)  Quantitative Gated Spect Images  QGS EDV:  48 ml QGS ESV:  14 ml QGS EF:  71 %   Overall Impression   Exercise Capacity: Lexiscan with no exercise. BP Response: Normal blood pressure response. Clinical Symptoms: No chest pain ECG Impression: No significant ST segment change suggestive of ischemia. Overall Impression: Normal stress nuclear study.     PPM Specifications Following MD:  Everardo Beals. Juanda Chance, MD     PPM Vendor:  [employer]     PPM Model Number:  [id number]     PPM Serial Number:  [id number] PPM DOI:  07/03/2009     PPM Implanting MD:  Lewayne Bunting, MD  Lead 1    Location: RA     DOI: 07/03/2009     Model #: [id number]     Serial #: [id number]     Status: active Lead 2    Location: RV     DOI: 07/03/2009     Model #: [id number]     Serial #: [id number]     Status: active  Magnet Response Rate:  BOL 85 ERI  65  Indications:  CHB   Parameters Mode:  DDDR     Lower Rate Limit:  60     Upper Rate Limit:  130 Paced AV Delay:  150     Sensed AV Delay:  120  Impression & Recommendations:  Problem # 1:  CAD, AUTOLOGOUS BYPASS GRAFT (ICD-414.02) Pt is stable without further chest pain. Nuclear study was reviewed and showed normal findings. Will continue current medical therapy.  Her updated medication list for this problem includes:    Amlodipine  Besylate 10 Mg Tabs (Amlodipine besylate) .Marland Kitchen... 1 by mouth daily    Aspirin 81 Mg Tabs (Aspirin) .Marland Kitchen... 1 by mouth daily    Nitrostat 0.4 Mg Subl (Nitroglycerin) .Marland Kitchen... 1 tablet under tongue at onset of chest pain; you  may repeat every 5 minutes for up to 3 doses.    Metoprolol Tartrate 25 Mg Tabs (Metoprolol tartrate) .Marland Kitchen... Take 1 tablet by mouth two times a day  Problem # 2:  HOCM / IHSS (ICD-425.1) Echo reviewed and question of valvular aortic stenosis versus subaortic stenosis. Velocities are in the moderate range is this is valvular AS. Will continue observation, maintain adequate hydration.  Her updated medication list for this problem includes:    Amlodipine Besylate 10 Mg Tabs (Amlodipine besylate) .Marland Kitchen... 1 by mouth daily    Aspirin 81 Mg Tabs (Aspirin) .Marland Kitchen... 1 by mouth daily    Nitrostat 0.4 Mg Subl (Nitroglycerin) .Marland Kitchen... 1 tablet under tongue at onset of chest pain; you may repeat every 5 minutes for up to 3 doses.    Furosemide 20 Mg Tabs (Furosemide) .Marland Kitchen... Take 1 tablet by mouth once daily    Metoprolol Tartrate 25 Mg Tabs (Metoprolol tartrate) .Marland Kitchen... Take 1 tablet by mouth two times a day  Problem # 3:  HYPERTENSION, UNSPECIFIED (ICD-401.9) BP controlled.  Her updated medication list for this problem includes:    Amlodipine Besylate 10 Mg Tabs (Amlodipine besylate) .Marland Kitchen... 1 by mouth daily    Aspirin 81 Mg Tabs (Aspirin) .Marland Kitchen... 1 by mouth daily    Furosemide 20 Mg Tabs (Furosemide) .Marland Kitchen... Take 1 tablet by mouth once daily    Metoprolol Tartrate 25 Mg Tabs (Metoprolol tartrate) .Marland Kitchen... Take 1 tablet by mouth two times a day  BP today: 136/70 Prior BP: 156/62 (10/18/2009)  Labs Reviewed: K+: 4.9 (07/25/2009) Creat: : 2.1 (07/25/2009)     Patient Instructions: 1)  Your physician recommends that you continue on your current medications as directed. Please refer to the Current Medication list given to you today. 2)  Your physician wants you to follow-up in: 1 YEAR.  You will receive a  reminder letter in the mail two months in advance. If you don't receive a letter, please call our office to schedule the follow-up appointment.

## 2010-03-21 ENCOUNTER — Emergency Department (HOSPITAL_COMMUNITY): Payer: PRIVATE HEALTH INSURANCE

## 2010-03-21 ENCOUNTER — Emergency Department (HOSPITAL_COMMUNITY)
Admission: EM | Admit: 2010-03-21 | Discharge: 2010-03-21 | Disposition: A | Discharge status: Home / Self Care | Payer: PRIVATE HEALTH INSURANCE | Attending: Emergency Medicine | Admitting: Emergency Medicine

## 2010-03-21 DIAGNOSIS — E119 Type 2 diabetes mellitus without complications: Secondary | ICD-10-CM | POA: Insufficient documentation

## 2010-03-21 DIAGNOSIS — J449 Chronic obstructive pulmonary disease, unspecified: Secondary | ICD-10-CM | POA: Insufficient documentation

## 2010-03-21 DIAGNOSIS — E7800 Pure hypercholesterolemia, unspecified: Secondary | ICD-10-CM | POA: Insufficient documentation

## 2010-03-21 DIAGNOSIS — I2510 Atherosclerotic heart disease of native coronary artery without angina pectoris: Secondary | ICD-10-CM | POA: Insufficient documentation

## 2010-03-21 DIAGNOSIS — Z950 Presence of cardiac pacemaker: Secondary | ICD-10-CM | POA: Insufficient documentation

## 2010-03-21 DIAGNOSIS — N289 Disorder of kidney and ureter, unspecified: Secondary | ICD-10-CM | POA: Insufficient documentation

## 2010-03-21 DIAGNOSIS — I10 Essential (primary) hypertension: Secondary | ICD-10-CM | POA: Insufficient documentation

## 2010-03-21 DIAGNOSIS — J4489 Other specified chronic obstructive pulmonary disease: Secondary | ICD-10-CM | POA: Insufficient documentation

## 2010-03-21 DIAGNOSIS — R079 Chest pain, unspecified: Secondary | ICD-10-CM | POA: Insufficient documentation

## 2010-03-21 DIAGNOSIS — I252 Old myocardial infarction: Secondary | ICD-10-CM | POA: Insufficient documentation

## 2010-03-21 DIAGNOSIS — Z79899 Other long term (current) drug therapy: Secondary | ICD-10-CM | POA: Insufficient documentation

## 2010-03-21 LAB — CBC
Hemoglobin: 11.3 g/dL — ABNORMAL LOW (ref 12.0–15.0)
MCHC: 30.3 g/dL (ref 30.0–36.0)
RBC: 5.12 MIL/uL — ABNORMAL HIGH (ref 3.87–5.11)

## 2010-03-21 LAB — BASIC METABOLIC PANEL
BUN: 34 mg/dL — ABNORMAL HIGH (ref 6–23)
Chloride: 103 mEq/L (ref 96–112)
GFR calc non Af Amer: 25 mL/min — ABNORMAL LOW (ref >60)
Glucose, Bld: 150 mg/dL — ABNORMAL HIGH (ref 70–99)
Potassium: 4 mEq/L (ref 3.5–5.1)

## 2010-03-21 LAB — POCT CARDIAC MARKERS
CKMB, poc: <1 ng/mL — ABNORMAL LOW (ref 1.0–8.0)
Myoglobin, poc: 114 ng/mL (ref 12–200)
Troponin i, poc: <0.05 ng/mL (ref 0.00–0.09)

## 2010-04-01 LAB — GLUCOSE, CAPILLARY
Glucose-Capillary: 127 mg/dL — ABNORMAL HIGH (ref 70–99)
Glucose-Capillary: 156 mg/dL — ABNORMAL HIGH (ref 70–99)

## 2010-04-01 LAB — URINALYSIS, ROUTINE W REFLEX MICROSCOPIC
Ketones, ur: NEGATIVE mg/dL
Protein, ur: NEGATIVE mg/dL
Urobilinogen, UA: 0.2 mg/dL (ref 0.0–1.0)
pH: 5 (ref 5.0–8.0)

## 2010-04-01 LAB — POCT CARDIAC MARKERS
CKMB, poc: <1 ng/mL — ABNORMAL LOW (ref 1.0–8.0)
CKMB, poc: <1 ng/mL — ABNORMAL LOW (ref 1.0–8.0)
Myoglobin, poc: 129 ng/mL (ref 12–200)
Myoglobin, poc: 138 ng/mL (ref 12–200)
Myoglobin, poc: 140 ng/mL (ref 12–200)
Troponin i, poc: <0.05 ng/mL (ref 0.00–0.09)
Troponin i, poc: <0.05 ng/mL (ref 0.00–0.09)
Troponin i, poc: <0.05 ng/mL (ref 0.00–0.09)

## 2010-04-01 LAB — BASIC METABOLIC PANEL
BUN: 29 mg/dL — ABNORMAL HIGH (ref 6–23)
CO2: 26 mEq/L (ref 19–32)
Chloride: 110 mEq/L (ref 96–112)
Glucose, Bld: 114 mg/dL — ABNORMAL HIGH (ref 70–99)
Glucose, Bld: 98 mg/dL (ref 70–99)
Potassium: 4 mEq/L (ref 3.5–5.1)
Potassium: 4.4 mEq/L (ref 3.5–5.1)
Sodium: 142 mEq/L (ref 135–145)
Sodium: 143 mEq/L (ref 135–145)

## 2010-04-01 LAB — DIFFERENTIAL
Basophils Absolute: 0 K/uL (ref 0.0–0.1)
Basophils Relative: 0 % (ref 0–1)
Eosinophils Absolute: 0.1 K/uL (ref 0.0–0.7)
Eosinophils Relative: 1 % (ref 0–5)
Lymphocytes Relative: 17 % (ref 12–46)
Lymphs Abs: 1.3 K/uL (ref 0.7–4.0)
Monocytes Absolute: 0.3 K/uL (ref 0.1–1.0)
Monocytes Relative: 4 % (ref 3–12)
Neutro Abs: 5.8 K/uL (ref 1.7–7.7)
Neutrophils Relative %: 78 % — ABNORMAL HIGH (ref 43–77)

## 2010-04-01 LAB — CK TOTAL AND CKMB (NOT AT ARMC)
CK, MB: 2.5 ng/mL (ref 0.3–4.0)
Relative Index: 2.3 (ref 0.0–2.5)
Total CK: 107 U/L (ref 7–177)

## 2010-04-01 LAB — CARDIAC PANEL(CRET KIN+CKTOT+MB+TROPI)
CK, MB: 2.3 ng/mL (ref 0.3–4.0)
CK, MB: 2.5 ng/mL (ref 0.3–4.0)
Total CK: 82 U/L (ref 7–177)
Troponin I: 0.03 ng/mL (ref 0.00–0.06)

## 2010-04-01 LAB — CBC
HCT: 37.5 % (ref 36.0–46.0)
Hemoglobin: 11 g/dL — ABNORMAL LOW (ref 12.0–15.0)
MCH: 22.4 pg — ABNORMAL LOW (ref 26.0–34.0)
MCHC: 29.3 g/dL — ABNORMAL LOW (ref 30.0–36.0)
RBC: 4.98 MIL/uL (ref 3.87–5.11)
RBC: 5.19 MIL/uL — ABNORMAL HIGH (ref 3.87–5.11)
WBC: 6.5 10*3/uL (ref 4.0–10.5)
WBC: 7.5 10*3/uL (ref 4.0–10.5)

## 2010-04-01 LAB — TROPONIN I

## 2010-04-07 LAB — BASIC METABOLIC PANEL
CO2: 20 mEq/L (ref 19–32)
CO2: 22 mEq/L (ref 19–32)
CO2: 26 mEq/L (ref 19–32)
Chloride: 107 mEq/L (ref 96–112)
Chloride: 111 mEq/L (ref 96–112)
Chloride: 112 mEq/L (ref 96–112)
Creatinine, Ser: 2.29 mg/dL — ABNORMAL HIGH (ref 0.4–1.2)
GFR calc Af Amer: 25 mL/min — ABNORMAL LOW (ref >60)
GFR calc Af Amer: 28 mL/min — ABNORMAL LOW (ref >60)
GFR calc non Af Amer: 18 mL/min — ABNORMAL LOW (ref >60)
Glucose, Bld: 71 mg/dL (ref 70–99)
Potassium: 4.8 mEq/L (ref 3.5–5.1)
Potassium: 5.1 mEq/L (ref 3.5–5.1)
Potassium: 5.6 mEq/L — ABNORMAL HIGH (ref 3.5–5.1)
Sodium: 139 mEq/L (ref 135–145)
Sodium: 141 mEq/L (ref 135–145)
Sodium: 141 mEq/L (ref 135–145)

## 2010-04-07 LAB — CBC
HCT: 33.6 % — ABNORMAL LOW (ref 36.0–46.0)
HCT: 36.2 % (ref 36.0–46.0)
Hemoglobin: 11 g/dL — ABNORMAL LOW (ref 12.0–15.0)
Hemoglobin: 11.1 g/dL — ABNORMAL LOW (ref 12.0–15.0)
Hemoglobin: 11.6 g/dL — ABNORMAL LOW (ref 12.0–15.0)
MCHC: 32.1 g/dL (ref 30.0–36.0)
MCHC: 32.7 g/dL (ref 30.0–36.0)
MCV: 78.6 fL (ref 78.0–100.0)
MCV: 80.2 fL (ref 78.0–100.0)
RBC: 4.34 MIL/uL (ref 3.87–5.11)
RBC: 4.51 MIL/uL (ref 3.87–5.11)
RDW: 15.9 % — ABNORMAL HIGH (ref 11.5–15.5)
RDW: 16.2 % — ABNORMAL HIGH (ref 11.5–15.5)

## 2010-04-07 LAB — HEMOGLOBIN A1C: Hgb A1c MFr Bld: 6.7 % — ABNORMAL HIGH (ref <5.7)

## 2010-04-07 LAB — POTASSIUM: Potassium: 5.4 meq/L — ABNORMAL HIGH (ref 3.5–5.1)

## 2010-04-07 LAB — CARDIAC PANEL(CRET KIN+CKTOT+MB+TROPI)
Relative Index: INVALID (ref 0.0–2.5)
Total CK: 96 U/L (ref 7–177)
Total CK: 98 U/L (ref 7–177)
Troponin I: 0.03 ng/mL (ref 0.00–0.06)

## 2010-04-07 LAB — LIPID PANEL
Cholesterol: 116 mg/dL (ref 0–200)
HDL: 64 mg/dL (ref >39)
LDL Cholesterol: 42 mg/dL (ref 0–99)
Triglycerides: 50 mg/dL (ref <150)

## 2010-04-07 LAB — GLUCOSE, CAPILLARY
Glucose-Capillary: 114 mg/dL — ABNORMAL HIGH (ref 70–99)
Glucose-Capillary: 125 mg/dL — ABNORMAL HIGH (ref 70–99)
Glucose-Capillary: 130 mg/dL — ABNORMAL HIGH (ref 70–99)
Glucose-Capillary: 135 mg/dL — ABNORMAL HIGH (ref 70–99)
Glucose-Capillary: 141 mg/dL — ABNORMAL HIGH (ref 70–99)
Glucose-Capillary: 145 mg/dL — ABNORMAL HIGH (ref 70–99)
Glucose-Capillary: 148 mg/dL — ABNORMAL HIGH (ref 70–99)
Glucose-Capillary: 148 mg/dL — ABNORMAL HIGH (ref 70–99)
Glucose-Capillary: 154 mg/dL — ABNORMAL HIGH (ref 70–99)
Glucose-Capillary: 180 mg/dL — ABNORMAL HIGH (ref 70–99)
Glucose-Capillary: 182 mg/dL — ABNORMAL HIGH (ref 70–99)
Glucose-Capillary: 187 mg/dL — ABNORMAL HIGH (ref 70–99)
Glucose-Capillary: 188 mg/dL — ABNORMAL HIGH (ref 70–99)
Glucose-Capillary: 202 mg/dL — ABNORMAL HIGH (ref 70–99)
Glucose-Capillary: 216 mg/dL — ABNORMAL HIGH (ref 70–99)

## 2010-04-07 LAB — COMPREHENSIVE METABOLIC PANEL
CO2: 19 mEq/L (ref 19–32)
Calcium: 8.4 mg/dL (ref 8.4–10.5)
Creatinine, Ser: 2.69 mg/dL — ABNORMAL HIGH (ref 0.4–1.2)
GFR calc non Af Amer: 17 mL/min — ABNORMAL LOW (ref >60)
Glucose, Bld: 101 mg/dL — ABNORMAL HIGH (ref 70–99)

## 2010-04-07 LAB — BRAIN NATRIURETIC PEPTIDE
Pro B Natriuretic peptide (BNP): 704 pg/mL — ABNORMAL HIGH (ref 0.0–100.0)
Pro B Natriuretic peptide (BNP): 866 pg/mL — ABNORMAL HIGH (ref 0.0–100.0)

## 2010-04-18 ENCOUNTER — Ambulatory Visit: Payer: Medicare Other | Admitting: Physician Assistant

## 2010-04-25 LAB — URINE MICROSCOPIC-ADD ON

## 2010-04-25 LAB — URINALYSIS, ROUTINE W REFLEX MICROSCOPIC
Bilirubin Urine: NEGATIVE
Hgb urine dipstick: NEGATIVE
Specific Gravity, Urine: 1.016 (ref 1.005–1.030)
Urobilinogen, UA: 0.2 mg/dL (ref 0.0–1.0)

## 2010-04-25 LAB — URINE CULTURE

## 2010-05-05 LAB — DIFFERENTIAL
Basophils Absolute: 0 10*3/uL (ref 0.0–0.1)
Basophils Relative: 0 % (ref 0–1)
Eosinophils Absolute: 0 10*3/uL (ref 0.0–0.7)
Lymphocytes Relative: 9 % — ABNORMAL LOW (ref 12–46)
Lymphs Abs: 0.7 10*3/uL (ref 0.7–4.0)
Monocytes Relative: 5 % (ref 3–12)
Neutro Abs: 6.3 10*3/uL (ref 1.7–7.7)

## 2010-05-05 LAB — COMPREHENSIVE METABOLIC PANEL
ALT: 11 U/L (ref 0–35)
AST: 19 U/L (ref 0–37)
Albumin: 3.5 g/dL (ref 3.5–5.2)
CO2: 29 mEq/L (ref 19–32)
Chloride: 103 mEq/L (ref 96–112)
Creatinine, Ser: 1.36 mg/dL — ABNORMAL HIGH (ref 0.4–1.2)
GFR calc Af Amer: 45 mL/min — ABNORMAL LOW (ref >60)
GFR calc non Af Amer: 37 mL/min — ABNORMAL LOW (ref >60)
Sodium: 140 mEq/L (ref 135–145)
Total Bilirubin: 0.6 mg/dL (ref 0.3–1.2)

## 2010-05-05 LAB — URINE MICROSCOPIC-ADD ON

## 2010-05-05 LAB — CBC
RBC: 5.33 MIL/uL — ABNORMAL HIGH (ref 3.87–5.11)
WBC: 7.4 10*3/uL (ref 4.0–10.5)

## 2010-05-05 LAB — URINALYSIS, ROUTINE W REFLEX MICROSCOPIC
Glucose, UA: >1000 mg/dL — AB
Hgb urine dipstick: NEGATIVE
Specific Gravity, Urine: 1.017 (ref 1.005–1.030)
pH: 6.5 (ref 5.0–8.0)

## 2010-05-05 LAB — POCT CARDIAC MARKERS
CKMB, poc: <1 ng/mL — ABNORMAL LOW (ref 1.0–8.0)
Troponin i, poc: <0.05 ng/mL (ref 0.00–0.09)

## 2010-06-03 NOTE — Assessment & Plan Note (Signed)
[facility]                            CARDIOLOGY OFFICE NOTE   [name], [name]                        MRN:          [id number]  DATE:01/31/2008                            DOB:          Sep 18, 1922    REASON FOR VISIT:  Followup CAD, status post CABG.   HISTORY OF PRESENT ILLNESS:  [name] [name] is an delightful 77 year old  woman with coronary artery disease and prior coronary artery bypass  surgery.  She also has hypertension, dyslipidemia, and mild aortic  stenosis with subvalvular outflow tract obstruction.  The patient was  hospitalized at [facility] in November with weakness.  Her  cardiac medications were changed at that time.  She did not have  specific symptoms of dyspnea or chest pain.  She was told that her  medicines were messing with her heart, and she also required 2 units  of packed red blood cells during that admission.  She reports undergoing  a stress test and other cardiac studies during her hospital stay.  I do  not have the copy of those records at this time.   From a symptomatic standpoint, she is feeling better.  She denies  dyspnea, edema, orthopnea, PND, or chest pain.  She has no complaints  today.   MEDICATIONS:  1. Amlodipine 10 mg daily.  2. Insulin as directed.  3. Hyzaar 100/25 mg daily.  4. Protonix 40 mg daily.  5. Simvastatin 20 mg daily.  6. Aspirin 81 mg daily.  7. Nebulizer treatments 4 times daily.   PHYSICAL EXAMINATION:  GENERAL:  She is alert and oriented elderly woman  in no acute distress.  VITAL SIGNS:  Weight is 120 pounds, blood pressure 126/60, heart rate  88, and respiratory rate is 12.  HEENT:  Normal.  NECK:  Normal carotid upstrokes.  No bruits.  JVP is normal.  LUNGS:  Clear bilaterally.  HEART:  Regular rate and rhythm.  There is a 2/6 systolic ejection  murmur at the left lower sternal border.  ABDOMEN:  Soft and nontender.  No organomegaly.  EXTREMITIES:  There is no clubbing,  cyanosis, or edema.  Peripheral  pulses are intact and equal.   EKG shows sinus rhythm with biatrial enlargement and right bundle branch  block with age indeterminate inferior MI.  Tracing is unchanged from  previous tracing dated on July 18, 2007.   ASSESSMENT:  1. Coronary artery disease status post coronary artery bypass graft.      The patient is having no angina.  It sounds like she underwent a      stress test during her recent hospitalization.  We have requested      those records.  Continue current medical therapy which includes a      daily aspirin and antihypertensive regimen as well as a statin.      She is not on a beta-blocker because of her lung disease.  2. Hypertension.  Blood pressure under excellent control on her      current regimen.  3. Dyslipidemia.  The patient is on low-dose simvastatin.  She is  to      follow by Dr. Nathanial Rancher.  4. Aortic stenosis.  She appears to have mild aortic valve stenosis as      well as subvalvular left ventricular outflow tract obstruction.      Her murmur is quiet on exam today.  She does not appear to be      symptomatic.  We will continue to follow medically.   For followup, plan on seeing [name] [name] in 1 year.  I would be happy to  see her sooner if she has any cardiac problems.  We will review her  records from [facility] when they are available.     Veverly Fells. Excell Seltzer, MD  Electronically Signed    MDC/MedQ  DD: 01/31/2008  DT: 02/01/2008  Job #: [id number]   cc:   Burnell Blanks, MD

## 2010-06-03 NOTE — Assessment & Plan Note (Signed)
[facility]                            CARDIOLOGY OFFICE NOTE   [name] [name], [name] [name]                        MRN:          [id number]  DATE:11/09/2006                            DOB:          [birth year]    The patient was seen in follow-up at the [facility] Cardiology Office on  November 09, 2006.  The patient is a delightful 77 year old woman with  coronary artery disease status post bypass surgery.  She also has  diabetes and difficult to control hypertension.  She has been followed  by E. Graceann Congress, MD, [facility] and I will be assuming her care from here  forward.  The patient also has aortic stenosis and has had stable  findings on her serial echocardiography.  Her gradients back in November  of 2007 showed a mean aortic valve gradient of 18 and a peak gradient of  31 mmHg.   She also had a nuclear stress study that was normal in November of 2007  with a calculated ejection fraction of 79% and normal perfusion.   From a symptomatic standpoint, the patient is severely limited with  exertional dyspnea.  This is a chronic condition for her.  She denies  chest pain, palpitations, lightheadedness, or syncope.   CURRENT MEDICATIONS:  1. Insulin as directed.  2. Metformin 1 gram twice daily.  3. Iron 325 mg one to three times daily.  4. Clonidine 0.3 mg twice daily.  5. Aspirin 325 mg daily.  6. Nebulizer treatments four times daily.  7. Hyzaar 100/25 mg daily.  8. Protonix 40 mg daily.  9. Simvastatin 20 mg daily.  10.Norvasc 10 mg daily.   ALLERGIES:  ACE INHIBITORS cause angioedema.   PHYSICAL EXAMINATION:  GENERAL:  The patient is alert and oriented.  She  is an elderly woman in no acute distress.  VITAL SIGNS:  Weight 135, blood pressure 150/80, heart rate 91,  respiratory rate 20.  HEENT:  Normal.  NECK:  Normal carotid upstrokes without bruits.  Jugular venous pressure  is normal.  LUNGS:  Clear to auscultation bilaterally.  HEART:   Regular rate and rhythm with a 2/6 short ejection murmur at the  upper sternal borders.  There is no diastolic murmur or gallop.  ABDOMEN:  Soft, nontender, and no organomegaly.  EXTREMITIES:  No cyanosis, clubbing, or edema.   EKG shows normal sinus rhythm with right bundle branch block and  biatrial enlargement.   ASSESSMENT:  1. Coronary artery disease status post coronary artery bypass graft      surgery.  No evidence of angina at present.  Continue medical      therapy with aspirin and statin.  She was recently started on      Simvastatin earlier this year.  I have written her a prescription      for lipids and LFT's to be rechecked at Dr. Miguel Rota office which      is more convenient for her.  2. Hypertension.  While her control is not ideal, she reports white      coat hypertension.  She is on multiple  medications for blood      pressure including clonidine, Hyzaar, and Norvasc.  We will      continue her current medical therapy without changes at this point.  3. Dyslipidemia.  As above, lipids and LFT's to be rechecked after      starting Simvastatin 20 mg earlier this year.   FOLLOWUP:  I would like to see the patient back in six months.  She  appears to be stable from a cardiovascular standpoint.  If she has  problems in the interim, I would be happy to see her at any time.     Veverly Fells. Excell Seltzer, MD  Electronically Signed    MDC/MedQ  DD: 11/09/2006  DT: 11/09/2006  Job #: [id number]   cc:   Burnell Blanks, MD

## 2010-06-03 NOTE — Assessment & Plan Note (Signed)
[facility]                            CARDIOLOGY OFFICE NOTE   ANALEIGHA, NAUMAN                        MRN:          [id number]  DATE:07/18/2007                            DOB:          10-22-1922    [name] [name] was seen in follow up with [facility] Cardiology office on July 18, 2007.  She is a delightful 77 year old woman with coronary artery  disease and prior coronary bypass surgery.  [name] [name] also has  hypertension, dyslipidemia, and aortic stenosis.  Her most recent  echocardiogram from November 2007 showed a hyperdynamic left ventricle  with dynamic LV outflow tract obstruction and an LVEF of 75%-80%.  Aortic valve was calcified with reduced leaflet mobility.  The gradients  across the aortic valve were only mildly elevated, but the aortic valve  area was less than 1 cm.  The patient's estimated pulmonary pressures  were elevated at 68 mmHg.   The patient continues to be limited by pulmonary symptoms.  She has  severe COPD.  She denies chest pain, orthopnea, PND, or edema.   Current medications include insulin as directed, metformin 1 g twice  daily, Tandem plus iron 325 mg one-three tabs daily, nebulizer  treatments 4 times daily, Hyzaar 100/25 mg daily, Protonix 40 mg daily,  simvastatin 20 mg daily, Norvasc 10 mg daily, aspirin 81 mg daily, and  clonidine 0.3 mg at bedtime.   ALLERGIES:  ACE INHIBITORS cause angioedema.   PHYSICAL EXAMINATION:  GENERAL:  The patient is an elderly woman.  She  is in no acute distress.  VITAL SIGNS:  Weight is 127 pounds, blood pressure is 180/80, heart rate  is 103, and respiratory rate is 20.  HEENT:  Normal.  NECK:  Normal carotid upstrokes with soft bilateral carotid bruits.  LUNGS:  Clear bilaterally without wheezing.  CARDIOVASCULAR:  The apex is discrete and nondisplaced.  The heart is  tachycardiac and regular.  There is a grade 3/6 harsh systolic ejection  murmur best heard at the left lower  sternal border.  There are no  gallops or diastolic murmurs.  ABDOMEN:  Soft, nontender, and no organomegaly.  EXTREMITIES:  No clubbing, cyanosis, or edema.   EKG shows some multifocal atrial tachycardia with a heart rate of 103  beats per minute.  There is a right bundle branch block and left  anterior fascicular block.   ASSESSMENT:  1. Coronary artery disease status post coronary artery bypass graft.      A Myoview stress study from November 2007 was normal with probable      apical thinning.  Normal perfusion throughout.  Continue medical      therapy which includes aspirin, Zocor, and antihypertensive      therapy.  Beta-blocker not used because of severe chronic      obstructive pulmonary disease.  2. Multifocal atrial tachycardia.  This rhythm is in the setting of      her advanced lung disease.  I think we should treat her heart      rhythm to try to achieve a slower ventricular rate in  the setting      of her underlying coronary artery disease.  We will transition her      from Norvasc to Cardizem.  I have asked her to discontinue Norvasc      and start Cardizem CD 240 mg daily.  Other cardiac medications      should remain unchanged.  3. Dyslipidemia.  The patient is followed by Dr. Nathanial Rancher.  Lipids from      October 2008 showed a total cholesterol of 132 with an HDL of 83      and an LDL of 36.  4. Aortic stenosis.  She appears to have valvular aortic stenosis as      well as subvalvular left ventricular outflow tract obstruction.      Hopefully, Cardizem should work as a negative inotrope and may      provide some improvement with her hyperdynamic left ventricular and      subvalvular obstruction.  Continue observation.  Difficult to      determine whether the patient is symptomatic due to her underlying      lung disease.  She is not a redo surgical candidate.   For follow up, I would like to see [name] [name] back in 6 months.     [name] [name]. Excell Seltzer, MD   Electronically Signed    MDC/MedQ  DD: 07/18/2007  DT: 07/19/2007  Job #: [id number]

## 2010-06-06 NOTE — H&P (Signed)
[place]  Patient:    [name], [name]                        MRN: [id number] Adm. Date:  [id number] Attending:  Sharyn Dross                         History and Physical  SUBJECTIVE:  This pleasant 77 year old black female was admitted into the hospital with a hemoglobin of 6.9.  The patient is new to the practice, just becoming a member of the practice at this time.  She had complaints of increasing shortness of breath that is present, but it is documented that she has a history of chronic obstructive pulmonary disease that is present.  She is presently not sure of the medications that she is taking, but she has been taking medication for hypertension, as well as for diabetes that is noted.  She denies any history of any chest discomfort at this time.  Routine laboratory data was obtained on an outpatient basis and she was found to have a hemoglobin of approximately 6 that was noted.  This has even decreased further from her previous anemia in 1998 of 8 that was noted.  She was subsequently brought into the hospital for evaluation of what is the triggering factor for the anemia that is noted.  She denies any history of any hematemesis or any history of any melenic stools hat is noted.  She states that the stools have been dark in color, but she has not noted black stools as noted.  She denies any history of any vaginal bleeding or  menorrhagia that has been present at this time also.  She denies any history of any abdominal pain or any history of peptic ulcer disease or gallbladder or liver disease that is present at this time. She denies any specific weight loss process that is ongoing.  FAMILY HISTORY:  Positive for cardiac disease also a brother has coronary artery disease.  SOCIAL HISTORY:  The patient in the past had smoked but no alcohol use is noted. It is noted that the patient presently since 1998 has stopped smoking  that is noted.  RECENT SURGERIES:  The patient had triple bypass surgery done in 1998 for severe coronary artery disease that is present at this time.  CURRENT MEDICATIONS: 1. Norvasc 10 mg q.d. 2. Hyzaar 50/12.5 mg q.d. 3. Glucophage 500 mg two tablets b.i.d. 4. Glyburide 5 mg q.d. 5. Clonidine 0.3 mg q.d. 6. Albuterol inhaler two puffs b.i.d. 7. Insulin 70/30 8 mg q. a.m. and q. h.s.  REVIEW OF SYSTEMS: Essentially positive for diabetes mellitus, chronic obstructive pulmonary disease, coronary artery disease at this time and hypertension.  OBJECTIVE FINDINGS:  GENERAL:  She is a pleasant female, appears to be in no acute distress.  VITAL SIGNS:  Stable.  HEENT:  Examination appears to be anicteric.  NECK:  Supple.  LUNGS:  Clear to auscultation and percussion.  HEART:  Regular rate and rhythm without heaves, thrills, murmurs, or gallops.  soft S1, S2 that is appreciated.  ABDOMEN:  Soft. There did not appear to be any tenderness to palpation, no hepatosplenomegaly that is noted at this time.  EXTREMITIES:  Within normal limits.  LABORATORY DATA:  Hemoglobin of approximately 6.  This was repeated but the results are presently not available at the moment.  GROSS IMPRESSION:  Anemia, etiology unknown.  Rule out  secondary to chronic process, rule out possible occult process.  Bleeding, I think, is less likely but should also be considered.  PRESENT RECOMMENDATIONS: 1. I am going to presently type and cross-match the patient and transfuse the    hemoglobin to stabilize. 2. Will do a GI work up to assure that there is no evidence of any acid-peptic    disease or any evidence of a possible occult process or neoplastic process that    could be triggering factor. Depending upon these results, will determine the  course of therapy. 3. Will monitor her sugar levels and make sure they are relatively stable. 4. Keep an eye on her pulmonary status, as well as her  cardiovascular status during    the interim.  Depending upon these results, will determine the course of    therapy. DD:  04/09/99 TD:  04/09/99 Job: [id number]

## 2010-06-06 NOTE — Letter (Signed)
March 02, 2006    Burnell Blanks, MD  [address]  [place], [place] [address]   RE:  [name] [name], [name] [name]  MRN:  [id number]  /  DOB:  18-Aug-1922   Dear Sharman Crate:   [name] was seen in the office March 02, 2006  for followup.  As you know, she has multiple medical problems with  diabetes, coronary artery disease, 8-and-a-half years post CABG by Dr.  Evelene Croon, moderate aortic stenosis, mild aortic regurgitation.  In  addition, she has diabetes mellitus.  She has been feeling well with no  cardiac symptoms.  Her most recent echocardiogram November 23, 2005  revealed no increase in gradient with peak av velocity of 279, mean  gradient of 18 and peak of 31.   The patient's stress nuclear study was normal.   MEDICATIONS:  1. Include insulin.  2. Metformin 1 g b.i.d.  3. Glyburide 5.  4. Iron.  5. Clonidine 0.3 b.i.d.  6. Aspirin 325.  7. Nebulizer.  8. Norvasc.  9. Benazepril 10/20 daily.  10.Hyzaar 100/25 daily.  11.Protonix.   Most recent LDL in July 2007 was 63 with a total cholesterol at 160.  Renal profile revealed a BUN of 24, creatinine of 1.   PHYSICAL EXAM:  Blood pressure initially 182/84.  When I took it, it was  140/65 bilaterally.  Pulse 96.  GENERAL APPEARANCE:  Normal.  JVPs not elevated.  Carotid pulses palpable and equal without bruits.  LUNGS:  Clear.  CARDIAC:  Reveals a 2/6 short systolic ejection murmur in the aortic  area and left sternal border.  No diastolic murmur.  ABDOMEN:  Reveals some fullness with slight pulsation in the  epigastrium.  EXTREMITIES:  No edema.   IMPRESSION:  1. Diabetes.  2. Hypertension, labile despite multiple meds.  3. Coronary artery disease 8-and-a-half years post coronary artery      bypass grafting.  4. Moderate aortic stenosis, stable.   I suggest the patient continue on the same therapy.  Would like to get  an abdominal ultrasound to rule out abdominal aortic aneurysm.  Also a  renal  duplex scan to evaluate her for renal artery stenosis, as well as  the labile hypertension despite multiple meds.  In addition, because of  the  diabetes and coronary artery disease, and aortic stenosis, I  suggest she start simvastatin 20.  She should have followup lipids and  LFTs in 6 weeks.  The patient states that she is to have some routine  labs by you within the next few days.   ADDENDUM:  The EKG reveals left anterior hemiblock and right bundle  branch block.   Thank you for the opportunity of sharing in this nice patient's care.    Sincerely,      E. Graceann Congress, MD, [facility]  Electronically Signed    EJL/MedQ  DD: 03/02/2006  DT: 03/02/2006  Job #: [id number]

## 2010-06-06 NOTE — Op Note (Signed)
[place]. [facility]  Patient:    [name] [name], [name] [name]                        MRN: [id number] Proc. Date: 04/10/99 Adm. Date:  [id number] Attending:  Sharyn Dross CC:         Sharyn Dross., M.D.                           Operative Report  PREOPERATIVE DIAGNOSIS:  Marked anemia, etiology unknown, possibly secondary to  neoplastic process or acid peptic disease.  POSTOPERATIVE DIAGNOSIS:  Grossly normal endoscopic examination.  PROCEDURE:  Esophagogastroduodenoscopy.  MEDICATION:  Demerol 20 mg IV, Versed 2 mg IV over a 10 min period of time.  INSTRUMENT:  Olympus video panendoscope.  ENDOSCOPIST:  Sharyn Dross., M.D.  INFORMED CONSENT:  The patient was advised of the procedure, indications and risks involved.  The patient has agreed to have the procedure performed.  PREOPERATIVE PREPARATION:  The patient is brought into the endoscopy unit with n IV for IV sedating medication.  A monitor was placed on the patient to monitor vital signs and oxygen saturations.  Nasal oxygen at two liters per minute was used.  Adequate sedation was performed.  The procedure was begun.  PROCEDURE NOTE:  The instrument was advanced with the patient lying in the left  lateral decubitus position via direct technique without difficulty.  The oropharyngeal, epiglottis, vocal cords and pyriform sinuses appeared to be grossly within normal limits.  The esophagus was normal without any evidence of acute inflammation, ulcerations, hiatal hernias or varices appreciated.  The gastric area showed a normal mucous lake without any evidence of acute inflammation or ulcerations that were noted.  The pylorus appeared to show evidence of antral spasms and contractions, noted with caloric orifice noted distally to the antral spasms.  The instrument was then advanced through these regions.  At this time the duodenal bulb and second portion appeared to be within normal  limits. The instrument was retracted back, with a biopsy for the CLOtest performed. Retroflex view of the cardia revealed a slightly relaxed lower esophageal sphincter, but o gross abnormality noted.  The Z-line appeared to be 35 cm distal to the esophagus. The instrument was retracted back and removed perioral without difficulty.  The  patient tolerated the procedure well.  There was no evidence of any post-endoscopic trauma to the tissues noted.  TREATMENT: 1. Will rate the results of the CLOtest. 2. Conservative management today. 3. Proceed with the colonoscopic examination in the a.m. DD:  04/10/98 TD:  04/10/99 Job: [id number]

## 2010-06-06 NOTE — Discharge Summary (Signed)
NAMECORBIN, HOTT                 ACCOUNT NO.:  [id number]   MEDICAL RECORD NO.:  [id number]          PATIENT TYPE:  INP   LOCATION:  2029                         FACILITY:  [facility]   PHYSICIAN:  Cecil Cranker, MD, FACCDATE OF BIRTH:  [birth year]   DATE OF ADMISSION:  11/11/2005  DATE OF DISCHARGE:  11/12/2005                                 DISCHARGE SUMMARY   DICTATED BY:  Tereso Newcomer, PA-C.   DATE OF ADMISSION:  November 11, 2005.   DATE OF DISCHARGE:  November 12, 2005.   PRIMARY CARDIOLOGIST:  Cecil Cranker, MD, [facility].   PRIMARY CARE PHYSICIAN:  Maura L. Hamrick, M.D.   REASON FOR ADMISSION:  Chest pain.   DISCHARGE DIAGNOSES:  1. Chest pain, etiology unclear.      a.     Myocardial infraction ruled out by enzymes.  2. Coronary artery disease status post CABG.      a.     Patent grafts by catheterization 2002.      b.     Adenosine Myoview November 2006 with normal results.  3. EF 75-85%.  4. Aortic stenosis with mean gradient of 20 mmHg.  5. Mild mitral regurgitation.  6. Mild airway insufficiency.  7. COPD/pulmonary hypertension.  8. Diabetes mellitus type 2.  9. Hypertension.  10.Iron deficiency anemia.   HISTORY:  Ms. Cowing is an 77 year old female patient followed by Dr. Corinda Gubler  who presented via EMS to [facility] ER with complaints of chest discomfort.  Around 4:00 a.m. she returned from using the bathroom to her bed.  After she  laid down she developed anterior chest aching sensation.  She denied any  associated shortness of breath, nausea, vomiting, or diaphoresis.  She did  take nitroglycerin, was relieved.  She also took baby aspirin.  She was  concerned and came to the emergency room.  She was admitted for further  evaluation and treatment.   HOSPITAL COURSE:  The patient ruled out for myocardial infarction by  enzymes.  She denied any recurrent chest pain.  She noted after further  review that she had this aching sensation in the past.  It is  nothing like  what she had in the past with her angina.  She also notes increasing GERD  symptoms.  She has noted water brash symptoms as well as increased belching  as well as a globus sensation.  She was evaluated by Dr. Corinda Gubler on the day  of discharge.  He felt she was stable enough to discharge home with plans  for outpatient Myoview testing.  She was started empirically on Protonix  pump inhibitor given her symptoms of GERD.   LABORATORY DATA:  Cardiac markers negative x3, hemoglobin A1c 5.9, TSH  0.459, INR 1, white count 7000, hemoglobin 10.6, hematocrit 33.4, MCV 69.4,  platelet count 227,000.  Sodium 139, potassium 4.8, glucose 116, BUN 20,  creatinine 0.9, LFT's okay, total protein 7, albumin 3.4.  Chest x-ray -  COPD, cardiomegaly, no acute findings.   DISCHARGE MEDICATIONS:  1. Protonix 40 mg daily.  2. Clonidine 0.3 mg b.i.d.  3. Glyburide 5 mg daily.  4. Hyzaar 100/25 mg daily.  5. Norvasc 10 mg daily.  6. Insulin 75/30 15 units b.i.d.  7. Ferrous sulfate 325 mg three times a day.  8. Coated aspirin 81 mg daily.  9. Metformin 500 mg 2 tablets b.i.d.  10.Nitroglycerin p.r.n. chest pain.   ACTIVITY:  Increase slowly.   DIET:  Low-sodium diet and diabetic diet.   FOLLOWUP:  1. The patient will undergo Adenosine Myoview testing tomorrow October      26th at 11:45 a.m.  2. She will undergo echocardiogram November 5th at 10:30 a.m.  3. She will followup with Dr. Corinda Gubler on November 7th at 10:30 a.m.  4. She should follow up with Dr. Nathanial Rancher as directed.   Total physician and P.A. time greater than 30 minutes.           ______________________________  E. Graceann Congress, MD, [facility]     EJL/MEDQ  D:  11/12/2005  T:  11/13/2005  Job:  [id number]   cc:   Ailene Ravel, M.D.  Cecil Cranker, MD, [facility]

## 2010-06-06 NOTE — Discharge Summary (Signed)
[place]. [facility]  Patient:    [name] [name], [name] [name]                        MRN: [id number] Adm. Date:  [id number] Disc. Date: 04/12/99 Attending:  Sharyn Dross Dictator:   Sharyn Dross., M.D.                           Discharge Summary  ADMITTING DIAGNOSIS:  Marked anemia.  DISCHARGE DIAGNOSES: 1. Anemia, stabilized. 2. Diabetes mellitus. 3. Hypertension.  DISCHARGE CONDITION:  Stable and improved.  DISCHARGE MEDICATIONS: 1. To resume previous home meds. 2. Glucovance 5/500, 2 tablets q.a.m. and q.h.s. 3. Norvasc 10 mg q.d. 4. Hyzaar 50/12.5 on a daily basis. 5. Clonidine 0.3 mg q.d. 6. Albuterol inhaler 2 puffs b.i.d. 7. Humulin 70/30, 8 units in the morning and 4 units in the p.m. at this time. 8. Monitor her insulin levels every morning and then Mondays and Thursdays at lunch    time, Tuesday and Fridays at supper and Wednesday and Saturdays at bedtime.   FOLLOWUP:  With me in one week.  HOSPITAL COURSE:  The patient was admitted into the hospital because of a hemoglobin of 6 that was ______ .  She was typed and crossmatched and transfused up to approximately hemoglobin of 10 that was noted.  Her labs were checked this time and she underwent a GI workup to rule under the evidence of possible neoplastic process that was present.  Once these results appeared to be normal, the patients overall condition was stabilized at that time.  A blood pressure remained under control and diabetes levels appeared to varying, but relatively ranging in the mid 100 range at that time.  DISCHARGE DISPOSITION:  She is to be discharged to home today to use the Glucovance on a twice a day basis and to monitor her sugars as mentioned above.  FOLLOWUP:  She is to follow up with me in approximately one week for a reevaluation and to monitor her sugar levels at home during this process.  DISCHARGE INSTRUCTIONS:  She should be discharged with a 1500 to 1800  ADA low sodium diet which I will have the dietitian discuss with her prior to leaving.   The final ______  as above.  CLINICAL CONDITION:  Stable, then improved. DD:  04/12/99 TD:  04/13/99 Job: [id number]

## 2010-06-06 NOTE — Procedures (Signed)
[place]. [facility]  Patient:    [name] [name], [name] [name]                        MRN: [id number] Proc. Date: 04/11/99 Adm. Date:  [id number] Disc. Date: [id number] Attending:  Sharyn Dross                           Procedure Report  PREOPERATIVE DIAGNOSIS:  Marked anemia.  POSTOPERATIVE DIAGNOSIS:  Grossly normal colonoscopic examination to the ascending colon.  Cecal pouch not visualized due to technical reasons.  PROCEDURE:  Colonoscopy.  MEDICATIONS:  Demerol 50 mg IV, Versed 5 mg IV, over a 10-minute period of time.  INSTRUMENT:  Olympus video pan-colonoscope.  ENDOSCOPIST:  Sharyn Dross., M.D.  INFORMED CONSENT:  Patient was advised of the procedure, indications and risks involved.  She has agreed to have the procedure performed.  PREOPERATIVE PREPARATION:  Patient was brought into the endoscopy unit where an IV for IV sedating medication was started.  A monitor was placed on the patient to  monitor the patients vital signs and oxygen saturation.  Nasal oxygen at 2 L/min was used and after adequate sedation was performed, the procedure was begun.  BOWEL PREP:  The patient was given GoLYTELY and Reglan as a bowel prep.  The patient tolerated the prep well.  The quality of the prep was excellent.  DESCRIPTION OF PROCEDURE:  The instrument was advanced with the patient lying the left lateral position to approximately 130 cm from proximal colon to the proximal ascending colon that were noted.  There appeared to be no gross abnormalities such as masses, polyps or stricture  lesions appreciated.  The vascular pattern appeared to be well within normal limits throughout the entire colon.  The mucosal pattern showed no evidence of granular changes or diverticular changes noted.  The instrument was advanced from the mid-to-proximal ascending colon region.  The ileocecal valve was visualized; however, due to the turn and the increased redundancy that  were appreciated, the instrument could not advance in to visualize within the cecal pouch at this time. Manipulations of the area were performed.  We could no get good visualization within the pouch.  Because of this, the procedure was subsequently terminated at this time and instrument was retracted back at this time.  There was no evidence of any internal or external hemorrhoids or blood exuding from the area, although there was some mucosal irritation at the rectum that was appreciated.  The patient tolerated the procedure well.  TREATMENT: 1. Right now, conservative therapy but would recommend redoing a barium enema to    look at the right side of the colon and evaluate the cecal region at this time. 2. Will ensure that the patients transfusions have corrected. 3. Will discharge the patient to home within the next 24 hours after she    recuperates from this procedure. DD:  04/11/99 TD:  04/14/99 Job: [id number]

## 2010-06-06 NOTE — H&P (Signed)
[name], [name]                 ACCOUNT NO.:  [id number]   MEDICAL RECORD NO.:  [id number]          PATIENT TYPE:  INP   LOCATION:  2029                         FACILITY:  [facility]   PHYSICIAN:  Jonelle Sidle, MD DATE OF BIRTH:  12-14-1922   DATE OF ADMISSION:  11/11/2005  DATE OF DISCHARGE:                                HISTORY & PHYSICAL   PRIMARY PHYSICIAN:  Dr. Nathanial Rancher.   CARDIOLOGIST:  Dr. Glennon Hamilton.   PULMONOLOGIST:  Dr. Delton Coombes.   CVTS:  Dr. Laneta Simmers.   SUMMARY OF HISTORY:  [name] [name] is an 77 year old African American female who  was transported via EMS secondary to chest discomfort to [facility] ER.   Ms. [name] at approximately 4 a.m. after using the bathroom returned to bed.  When she laid down, she developed lower anterior chest aching sensation.  She denied radiation, associated shortness of breath, nausea, vomiting or  diaphoresis.  She gave it a 6 to a 7 on a scale of 0/10.  Over the next 15  minutes, she took a baby aspirin, two sublingual nitroglycerin which reduced  her discomfort to a zero.  In the meantime, she called 9-1-1, but by the  time EMS arrived, her chest discomfort had dissipated.  She was transported  to [facility] Emergency Room for further evaluation.  EMS report is not  available.  Since being in the emergency room, she has not had any further  episodes of chest discomfort.  She also describes it as an indigestion.  She  denies any unusual gas or water brash or meals.  She states that it has been  a long time since she has had to use nitroglycerin.  She also feels this  discomfort is different than prior to her bypass surgery but she cannot  really explain the differences.   PAST MEDICAL HISTORY:  Allergies include PENICILLIN for which she develops a  rash.   Medications prior to admission include clonidine 0.3 mg p.o. b.i.d.,  Glyburide 5 mg p.o. daily, Hyzaar 100 mg/25 mg p.o. daily, metformin 500 mg  2 tablets b.i.d. last dose 10:23  p.m., Norvasc 10 mg daily, unknown type of  insulin 15 units daily, iron unknown dosage daily, baby aspirin 81 mg daily,  nitroglycerin 0.4 p.r.n.   Past medical history is notable for severe COPD, pulmonary hypertension,  diabetes for which her sugars at home run between a low of 49 and in the low  100, hypertension, allergic rhinitis, myocardial infarction in 1998 with a  four-vessel bypass surgery, [phone] (with a LIMA to the LAD, saphenous  vein graft to the PDA saphenous vein graft to diagonal branch, saphenous  vein graft to the OM1).  Last catheterization on October 05, 2000 showed  native three-vessel coronary artery disease and patent grafts.  Last  echocardiogram December 01, 2004 showed an EF 75-85%, mild to moderate AS, a  PAP from the TR jet of 80.  Last Persantine-Cardiolite was in August 16, 2000.  It showed an EF of 72%, no ischemia.  She also has a history of chronic  right  bundle branch block and left anterior hemiblock.  Anemia requiring  transfusion in March 2001 for hemoglobin of six.   SOCIAL HISTORY:  She resides in [place] alone.  She is retired from the  Medical illustrator.  She has five children.  She is not  sure how many grandchildren or great-grandchildren she has.  She is  divorced.  She has not smoked since 1998; prior to that she smoked one and a  half packs per day for 30 years.  Denies any alcohol, drugs, herbal  medications, specific diet or exercise.   FAMILY HISTORY:  Her mother is deceased at age 40 with unknown type of  cancer.  Her father deceased unknown history.  Brother is deceased at age 66  with cancer.  She does not have any other brothers or sisters.   REVIEW OF SYSTEMS:  In addition to above is notable for occasional sinus  congestion and headaches, edentulous, glasses, chronic dyspnea on exertion  without recent changes, occasional cough productive of white sputum,  wheezing, nocturia, postmenopausal, left hand  numbness associated with  arthritis, rare GERD.   PHYSICAL EXAM:  GENERAL:  Well-nourished, well-developed pleasant African  American female in no apparent distress.  The daughter is present.  Temperature is 97 degrees, blood pressure was 173/86 and is on presentation  now 153/76, pulse 102, respirations 24, 97% sat on 2 liters.  HEENT:  Unremarkable except for edentulous.  NECK:  Supple without thyromegaly, adenopathy, JVD.  She does have carotid  bruits versus radiated murmur.  HEART:  PMI is not displaced.  Regular rate and rhythm.  Normal S1-S2.  I do  not appreciate S3 or S4.  She does have a 2/6 systolic murmur best  appreciated in the upper left sternal border radiating into the carotids.  Peripheral pulses are symmetrical and intact, probably 1+.  I do not  appreciate any abdominal or femoral bruits.  CHEST:  Symmetrical excursion.  Breath sounds are diminished but clear to  auscultation.  SKIN:  Integument is intact without rashes or lesions.  ABDOMEN:  Rounded, obese.  Bowel sounds present without organomegaly, masses  or tenderness.  EXTREMITIES:  Negative cyanosis, clubbing or edema.  MUSCULOSKELETAL/NEURO:  Grossly unremarkable.   CHEST X-RAY:  She has COPD, cardiomegaly, no active disease.  EKG shows  normal sinus rhythm, left axis deviation, normal intervals except for a  slightly widened QRS indicative of a right bundle branch block.  She also  has a left anterior hemiblock.  No changes from EKGs in March 2007 and  February 2002.  H&H 10.6 and 33.4 with MCV low at 69.4, MCHC is normal at  31.6, platelets 227, WBC 7.0.  I-stat shows a sodium of 139, potassium 4.6,  BUN 24, creatinine 1.1, glucose 104.  One point of care marker was within  normal limits.   IMPRESSION:  1. Chest pain syndrome, concerning for possible unstable angina without      new electrocardiogram changes and initial point of care markers are      within normal limits. 2. Hypertension.  3.  Microcytic anemia.  4. Chronic right bundle branch block and left anterior fascicular block,      history per past medical history as listed above.   DISPOSITION:  Dr. Diona Browner reviewed the patient's past medical history,  spoke with and examined the patient.  We will admit Ms. Jezewski for  observation overnight.  If she does not have any further chest discomfort  and cardiac  markers are normal, consideration could be given to an  outpatient stress Myoview within the next 48 hours;  otherwise cardiac catheterization may be pursued.  Will plan on Dr. Corinda Gubler  reviewing the situation tomorrow.  We will continue her home medications  except hold her metformin in case that Dr. Corinda Gubler would like to perform  cardiac catheterization.  We will place her on Lovenox 1 mg/kg q.12h. in  addition to checking her usual labs.     ______________________________  Joellyn Rued, PA-C      Jonelle Sidle, MD  Electronically Signed    EW/MEDQ  D:  11/11/2005  T:  11/12/2005  Job:  [id number]   cc:   Evelene Croon, M.D.  Leslye Peer, MD  Cecil Cranker, MD, [facility]

## 2010-06-06 NOTE — Cardiovascular Report (Signed)
[place]. [facility]  Patient:    [name] [name], [name] [name] Visit Number: [id number] MRN: [id number]          Service Type: CAT Location: [facility] 2899 16 Attending Physician:  Robynn Pane Dictated by:   Eduardo Osier Sharyn Lull, M.D. Proc. Date: 10/05/00 Admit Date:  10/05/2000   CC:         [name] Shipper. [name], M.D.  Dorise Hiss, M.D.  Cardiac Catheterization Lab   Cardiac Catheterization  PROCEDURE: 1. Left cardiac catheterization. 2. Selective left and right coronary angiography. 3, Visualization of saphenous vein graft to obtuse marginal #1,    diagonal #1, posterior descending artery, and visualization of    left internal mammary artery graft to left anterior descending artery.  CARDIOLOGISTEduardo Osier Sharyn Lull, M.D.  INDICATIONS FOR PROCEDURE:  Ms. [name] [name] is a 77 year old black female with past medical history significant for coronary artery disease status post MI in 1998, status post coronary artery bypass grafting in 1998, history of hypertension, insulin-requiring diabetes mellitus, bronchitis, chronic anemia, history of tobacco abuse, complaints of vague retrosternal chest pain off and on associated with exertional dyspnea with minimal exertion.  The patient denies any nausea, vomiting, or diaphoresis, denies rest or exertional angina, denies palpation, lightheadedness, or syncope.  The patient underwent Persantine Cardiolite on August 16, 2000, which showed mild anterior wall ischemia with ejection fraction of 72%.  Due to recurrent chest pain, exertional dyspnea, and mildly positive Persantine Cardiolite and multiple risks factors, discussion was held with the patient regarding various options of treatment; i.e., medical versus invasive, and consented for percutaneous coronary intervention.  PAST MEDICAL HISTORY:  As above.  PAST SURGICAL HISTORY:  She had coronary artery bypass grafting x 4 in December 1998.  She had LIMA to LAD, saphenous  vein graft to PDA, saphenous vein graft to diagonal #1, and saphenous vein graft to OM-1.  ALLERGIES:  She is allergic to PENICILLIN; she gets rash.  MEDICATIONS: 1. Hyzaar 50/12.5 mg p.o. q.d. 2. Norvasc 10 mg p.o. q.d. 3. Clonidine 0.3 mg p.o. b.i.d. 4. Enteric-coated aspirin 1 tablet daily. 5. Glyburide 5 mg p.o. q.d. 6. Metformin 500 mg p.o. b.i.d. 7. Novolin insulin 30/30, 8 units subcutaneously every day.  SOCIAL HISTORY:  She is single, has five children.  She smoked one and one-half packs per day for 30+ years, quit 6 years ago.  No history of alcohol abuse.  She is retired, worked in a Museum/gallery curator.  FAMILY HISTORY:  Father died of gangrene of the legs.  Mother died of cancer. Brother had coronary artery disease, had MI, and subsequently had stroke.  PHYSICAL EXAMINATION:  GENERAL:  Alert, awake, oriented x 3, in no acute distress.  VITAL SIGNS:  Blood pressure 130/80, pulse 72 and regular.  HEENT:  Conjunctivae pink.  NECK:  Supple.  No JVD, no bruit.  LUNGS:  Clear to auscultation without rhonchi or rales.  CARDIOVASCULAR:  S1 and S2 were normal.  There was no S3 gallop.  ABDOMEN:  Soft.  Bowel sounds were present.  Nontender.  EXTREMITIES:  There was no clubbing, cyanosis, or edema.  LABORATORY DATA:  Hemoglobin was 9.2, hematocrit 33.1, MCV chronically low at 64.  Potassium 4.7, BUN 17, creatinine 0.8, glucose 149.  PT and PTT were in therapeutic range.  IMPRESSION:  1. Accelerated angina.  2. Positive Persantine Cardiolite.  3. Coronary artery disease.  4. History of myocardial infarction.  5. Status post coronary artery bypass grafting.  6.  Hypertension.  7. Insulin-requiring diabetes mellitus.  8. History of bronchitis.  9. History of tobacco abuse. 10. Chronic anemia.  PLAN:  Discussed with patient and family regarding Persantine Cardiolite results and various options of treatment; i.e., medical versus invasive, risks and benefits; i.e.,  death, MI, stroke, need for emergency CABG, risk of restenosis, local vascular complications, etc., and consented for percutaneous coronary intervention.  DESCRIPTION OF PROCEDURE:  After obtaining informed consent, the patient was brought to the catheterization lab and was placed on fluoroscopy table.  The right groin was prepped and draped in usual fashion.  Xylocaine 2% was used for local anesthesia in the right groin.  With the help of a thin-walled needle, a 6-French arterial sheath was placed.  The sheath was aspirated and flushed.  Next, a 6-French left Judkins catheter was advanced over the wire under fluoroscopic guidance up to the ascending aorta.  The wire was pulled out.  The catheter was aspirated and connected to the manifold.  The catheter was further advanced and engaged into the left coronary ostium.  Multiple views of the left system were taken.  Next, the catheter was disengaged and was pulled out over the wire and was replaced with 6-French right Judkins catheter which was advanced over the wire under fluoroscopic guidance up to the ascending aorta.  The wire was pulled out.  The catheter was aspirated and connected to the manifold.  The catheter was further advanced and engaged into the right coronary ostium.  A single view of the right coronary artery was obtained.  Next, the catheter was disengaged and was advanced over the wire into the left subclavian.  Right Judkins was exchanged over the long wire with IMA catheter which was advanced over the wire under fluoroscopic guidance to the left subclavian artery and subsequently the IMA catheter was engaged into the LIMA.  Multiple views of the LIMA were taken. Next, the catheter was disengaged and was pulled out over the wire and was replaced with a 6-French  left bypass graft catheter which was advanced over the wire under fluoroscopic guidance up to the ascending aorta.  The wire was pulled out, the catheter  was aspirated and connected to the manifold.   The catheter was further advanced and engaged into saphenous vein graft to OM-1.  Multiple views of this graft were taken.  Next, the catheter was disengaged and engaged into saphenous vein graft to diagonal #1.  Multiple views of this graft were taken.  Next, the catheter was disengaged and was pulled out over the wire and was replaced with 6-French pigtal catheter whcih was advanced over the wire under fluoroscopic guidance to the ascending aorta.  The wire was pulled out.  The catheter was aspirated and connected to the manifold.  The catheter was further advanced across the aortic valve into the LV.  LV pressures were recorded.  Next, the left ventriculography was done in the 30-degree RAO position. Post-angiographic pressures are recorded from LV, and then pullback pressures were recorded from the aorta.  There was no gradient across the aortic valve. Next, the pigtail catheter was pulled out over the wire.  The sheathes were aspirated and flushed.  FINDINGS:  The patient has good LV systolic function.  The left main was patent.  LAD has 40% proximal stenosis and distally was filling by LIMA.  Diagonal #1 has 70% mid stenosis which was filling by saphenous vein graft.  Left circumflex has 99% proximal stenosis which was filling by saphenous vein  graft beyond the obstruction.  RCA was 100% occluded proximally.  Saphenous vein graft to PDA was patent.  LIMA to LAD was patent.  Saphenous vein graft to OM-1 was patent.  Saphenous vein graft to diagonal #1 was patent.  The patient tolerated the procedure well.  There were no complications. Attempted to Perclose the arteriotomy without success as it will grab only one wall of the artery.  Then manually the sheaths were pulled without any complications.  The patient was transferred to the recovery room in stable condition. Dictated by:   Eduardo Osier Sharyn Lull, M.D. Attending  Physician:  Robynn Pane DD:  10/05/00 TD:  10/05/00 Job: [id number]

## 2010-06-20 ENCOUNTER — Encounter: Payer: Self-pay | Admitting: Internal Medicine

## 2010-06-25 ENCOUNTER — Telehealth: Payer: Self-pay | Admitting: *Deleted

## 2010-06-26 ENCOUNTER — Telehealth: Payer: Self-pay | Admitting: *Deleted

## 2010-06-26 ENCOUNTER — Telehealth: Payer: Self-pay | Admitting: Internal Medicine

## 2010-06-26 NOTE — Telephone Encounter (Signed)
[facility] walk-in 06/25/10 states  needs surgical Clearance for cataract removal on June 18/2012 at the [facility] in [place]. Please fax information to the Eye clinic and call pt. When it is done. Patient last office visit was 02/14/10 with Dr. Excell Seltzer.

## 2010-06-26 NOTE — Telephone Encounter (Signed)
Pt's daughter wants to talk to someone because her mom talk to nurse earlier and pt didn't understand what she was talking about and she needs to get clearence for her eye surgery

## 2010-06-26 NOTE — Telephone Encounter (Signed)
Spoke with daughter  Will get form and ask Dr Ladona Ridgel to review tomorrow  The MD is concerned due to her having a pacemaker  Will find form and have Dr Ladona Ridgel review tomorrow and call daughter back with recomendations

## 2010-06-27 NOTE — Telephone Encounter (Signed)
Daughter called back and another telephone encounter was created.  Tresa Endo RN is handling this message in regards to clearance for surgery related to pacemaker.

## 2010-06-27 NOTE — Telephone Encounter (Signed)
Form faxed  Daughter aware

## 2010-07-09 ENCOUNTER — Encounter: Payer: Self-pay | Admitting: Internal Medicine

## 2010-07-09 ENCOUNTER — Ambulatory Visit (INDEPENDENT_AMBULATORY_CARE_PROVIDER_SITE_OTHER): Payer: PRIVATE HEALTH INSURANCE | Admitting: Internal Medicine

## 2010-07-09 DIAGNOSIS — Z950 Presence of cardiac pacemaker: Secondary | ICD-10-CM

## 2010-07-09 DIAGNOSIS — I442 Atrioventricular block, complete: Secondary | ICD-10-CM

## 2010-07-09 DIAGNOSIS — I10 Essential (primary) hypertension: Secondary | ICD-10-CM

## 2010-07-09 NOTE — Assessment & Plan Note (Signed)
Her blood pressure is well controlled today. She will continue her current medications and maintain a low-sodium diet. 

## 2010-07-09 NOTE — Progress Notes (Signed)
HPI  [name] returns today for followup. She is a pleasant elderly woman with a history of intermittent bradycardia and heart block status post permanent pacemaker insertion. She also has hypertension. Despite this she has done well. She does note however that she has had some mild problems asthma and is on inhalers for this.She denies anginal symptoms. No syncope. No peripheral edema. Allergies  Allergen Reactions  . Ace Inhibitors   . Penicillins      Current Outpatient Prescriptions  Medication Sig Dispense Refill  . albuterol (VENTOLIN HFA) 108 (90 BASE) MCG/ACT inhaler Inhale 2 puffs into the lungs every 4 (four) hours as needed.        Marland Kitchen amLODipine (NORVASC) 10 MG tablet Take 10 mg by mouth daily.        Marland Kitchen aspirin 81 MG tablet Take 81 mg by mouth daily.        . ferrous sulfate 325 (65 FE) MG tablet Take 325 mg by mouth 2 (two) times daily.        . furosemide (LASIX) 20 MG tablet Take 20 mg by mouth daily.        . insulin aspart (NOVOLOG) 100 UNIT/ML injection Inject into the skin. As directed.       . metoprolol tartrate (LOPRESSOR) 25 MG tablet Take 25 mg by mouth 2 (two) times daily.        . nitroGLYCERIN (NITROSTAT) 0.4 MG SL tablet Place 0.4 mg under the tongue every 5 (five) minutes as needed.        . pantoprazole (PROTONIX) 40 MG tablet Take 40 mg by mouth daily.        . simvastatin (ZOCOR) 20 MG tablet Take 20 mg by mouth at bedtime.           Past Medical History  Diagnosis Date  . Coronary atherosclerosis of autologous vein bypass graft   . 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 Unspecified essential hypertension   . Dyslipidemia   . Type II or unspecified type diabetes mellitus without mention of complication, not stated as uncontrolled   . DJD (degenerative joint disease)   . COPD (chronic obstructive pulmonary disease)     ROS:   All systems reviewed and negative except as noted in the HPI.   Past Surgical  History  Procedure Date  . Coronary artery bypass graft     x4 in 1998. She had LIMA to LAD, saphenous vein graft to PDA, saphenous vein graft to diagonal #1, and saphenous vein graft to OM-1.      No family history on file.   History   Social History  . Marital Status: Widowed    Spouse Name: N/A    Number of Children: N/A  . Years of Education: N/A   Occupational History  . Not on file.   Social History Main Topics  . Smoking status: Former Games developer  . Smokeless tobacco: Not on file   Comment: quit in 1998. she had smoked 1.5 ppd for 30 years   . Alcohol Use: No  . Drug Use: No  . Sexually Active: Not on file   Other Topics Concern  . Not on file   Social History Narrative   Resides in [place] alone. Retired. Divorced. No exercisePpm- [employer]      BP 122/66  Pulse 96  Ht 5' (1.524 m)  Wt 140 lb (63.504 kg)  BMI 27.34 kg/m2  Physical Exam:  Well  appearing NAD HEENT: Unremarkable Neck:  No JVD, no thyromegally Lymphatics:  No adenopathy Back:  No CVA tenderness Lungs:  Clear. Well-healed pacemaker incision HEART:  Regular rate rhythm, no murmurs, no rubs, no clicks Abd:  Flat, positive bowel sounds, no organomegally, no rebound, no guarding Ext:  2 plus pulses, no edema, no cyanosis, no clubbing Skin:  No rashes no nodules Neuro:  CN II through XII intact, motor grossly intact  DEVICE  Normal device function.  See PaceArt for details.   Assess/Plan:

## 2010-07-09 NOTE — Assessment & Plan Note (Signed)
The patient's device is working normally. We'll recheck in several months. 

## 2010-07-09 NOTE — Patient Instructions (Signed)
Your physician wants you to follow-up in: 6 months with the device clinic and Dr Ladona Ridgel in 12 months You will receive a reminder letter in the mail two months in advance. If you don't receive a letter, please call our office to schedule the follow-up appointment.

## 2010-07-15 NOTE — Telephone Encounter (Signed)
erroe--nt

## 2010-08-05 ENCOUNTER — Telehealth: Payer: Self-pay | Admitting: Internal Medicine

## 2010-08-05 NOTE — Telephone Encounter (Signed)
Spoke with daughter and she was unable to have surgery in June  The first form Dr Ladona Ridgel signed is in her chart and was faxed but only last a month  So they are going to refax the form today and I will have Dr Ladona Ridgel sign and send over again  Daughter aware

## 2010-08-05 NOTE — Telephone Encounter (Signed)
Per pt daughter called. Checking on status of paperwork that was drop off about a month ago. Pt having eye surgery.

## 2010-08-25 ENCOUNTER — Other Ambulatory Visit: Payer: Self-pay | Admitting: *Deleted

## 2010-08-25 MED ORDER — METOPROLOL TARTRATE 25 MG PO TABS: 25.0000 mg | ORAL_TABLET | Freq: Two times a day (BID) | ORAL | Status: DC

## 2011-01-27 ENCOUNTER — Ambulatory Visit: Payer: PRIVATE HEALTH INSURANCE | Admitting: Cardiovascular Disease

## 2011-01-30 ENCOUNTER — Encounter: Payer: Self-pay | Admitting: Cardiovascular Disease

## 2011-02-04 ENCOUNTER — Ambulatory Visit (INDEPENDENT_AMBULATORY_CARE_PROVIDER_SITE_OTHER): Payer: PRIVATE HEALTH INSURANCE | Admitting: Cardiovascular Disease

## 2011-02-04 ENCOUNTER — Encounter: Payer: Self-pay | Admitting: Cardiovascular Disease

## 2011-02-04 ENCOUNTER — Encounter: Payer: Self-pay | Admitting: Internal Medicine

## 2011-02-04 ENCOUNTER — Encounter: Payer: PRIVATE HEALTH INSURANCE | Admitting: *Deleted

## 2011-02-04 ENCOUNTER — Ambulatory Visit (INDEPENDENT_AMBULATORY_CARE_PROVIDER_SITE_OTHER): Payer: PRIVATE HEALTH INSURANCE | Admitting: *Deleted

## 2011-02-04 DIAGNOSIS — I359 Nonrheumatic aortic valve disorder, unspecified: Secondary | ICD-10-CM

## 2011-02-04 DIAGNOSIS — I25810 Atherosclerosis of coronary artery bypass graft(s) without angina pectoris: Secondary | ICD-10-CM

## 2011-02-04 DIAGNOSIS — I442 Atrioventricular block, complete: Secondary | ICD-10-CM

## 2011-02-04 DIAGNOSIS — I421 Obstructive hypertrophic cardiomyopathy: Secondary | ICD-10-CM

## 2011-02-04 NOTE — Assessment & Plan Note (Signed)
The patient is stable without anginal symptoms. She continues on aspirin for antiplatelet therapy. She is on low-dose simvastatin and lipids are followed by her primary care provider. I would like to see her back in 6 months.

## 2011-02-04 NOTE — Assessment & Plan Note (Signed)
The patient appears to have a combination of dynamic outflow obstruction as well as valvular aortic stenosis. We'll check a followup echocardiogram in 6 months when she returns for followup. Her chronic dyspnea is multifactorial and I think primarily related to chronic lung disease.

## 2011-02-04 NOTE — Patient Instructions (Signed)
Your physician wants you to follow-up in: 6 MONTHS. You will receive a reminder letter in the mail two months in advance. If you don't receive a letter, please call our office to schedule the follow-up appointment.  Your physician has requested that you have an echocardiogram in 6 MONTHS. Echocardiography is a painless test that uses sound waves to create images of your heart. It provides your doctor with information about the size and shape of your heart and how well your heart's chambers and valves are working. This procedure takes approximately one hour. There are no restrictions for this procedure.  Your physician recommends that you continue on your current medications as directed. Please refer to the Current Medication list given to you today.  

## 2011-02-04 NOTE — Progress Notes (Signed)
HPI:  78 year old woman presenting for followup evaluation. She has coronary artery disease status post remote coronary bypass surgery in 1998. 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 She had a permanent pacemaker placed in 2011 when she presented with complete heart block.  The patient continues to have dyspnea with minimal exertion. This is unchanged from her baseline. She denies resting dyspnea. She denies chest pain, lightheadedness, syncope, or edema.  Outpatient Encounter Prescriptions as of 02/04/2011  Medication Sig Dispense Refill  . albuterol (VENTOLIN HFA) 108 (90 BASE) MCG/ACT inhaler Inhale 2 puffs into the lungs every 4 (four) hours as needed.        Marland Kitchen amLODipine (NORVASC) 10 MG tablet Take 10 mg by mouth daily.        Marland Kitchen aspirin 81 MG tablet Take 81 mg by mouth daily.        . ferrous sulfate 325 (65 FE) MG tablet Take 325 mg by mouth 2 (two) times daily.        . furosemide (LASIX) 20 MG tablet Take 20 mg by mouth daily.        . insulin aspart (NOVOLOG) 100 UNIT/ML injection Inject into the skin. 15units in the am 12 units in the pm      . metoprolol tartrate (LOPRESSOR) 25 MG tablet Take 1 tablet (25 mg total) by mouth 2 (two) times daily.  60 tablet  6  . nitroGLYCERIN (NITROSTAT) 0.4 MG SL tablet Place 0.4 mg under the tongue every 5 (five) minutes as needed.        . pantoprazole (PROTONIX) 40 MG tablet Take 40 mg by mouth daily.        . traMADol (ULTRAM) 50 MG tablet Take 50 mg by mouth every 6 (six) hours as needed. 1-2 tablets every 6 hours as needed      . simvastatin (ZOCOR) 20 MG tablet Take 20 mg by mouth at bedtime.          Allergies  Allergen Reactions  . Ace Inhibitors   . Penicillins     Past Medical History  Diagnosis Date  . Coronary atherosclerosis of autologous vein bypass graft   . 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 Unspecified essential hypertension   . Dyslipidemia   . Type II or  unspecified type diabetes mellitus without mention of complication, not stated as uncontrolled   . DJD (degenerative joint disease)   . COPD (chronic obstructive pulmonary disease)     ROS: Negative except as per HPI  BP 110/56  Pulse 81  Ht 5' (1.524 m)  Wt 61.145 kg (134 lb 12.8 oz)  BMI 26.33 kg/m2  PHYSICAL EXAM: Pt is alert and oriented, elderly woman in NAD HEENT: normal Neck: JVP - normal, carotids 2+= without bruits Lungs: prolonged expiratory phase, scattered rhonchi. CV: RRR with grade 2/6 systolic murmur at the right sternal border Abd: soft, NT, Positive BS, no hepatomegaly Ext: no C/C/E, distal pulses intact and equal Skin: warm/dry no rash  EKG:  NSR with RBB and LAFB, unchanged from prior tracing in 2011.  ASSESSMENT AND PLAN:

## 2011-03-20 ENCOUNTER — Other Ambulatory Visit: Payer: Self-pay | Admitting: *Deleted

## 2011-03-20 MED ORDER — METOPROLOL TARTRATE 25 MG PO TABS: 25.0000 mg | ORAL_TABLET | Freq: Two times a day (BID) | ORAL | Status: DC

## 2011-05-22 ENCOUNTER — Encounter: Payer: Self-pay | Admitting: Cardiovascular Disease

## 2011-07-28 ENCOUNTER — Ambulatory Visit (INDEPENDENT_AMBULATORY_CARE_PROVIDER_SITE_OTHER): Payer: PRIVATE HEALTH INSURANCE | Admitting: Internal Medicine

## 2011-07-28 ENCOUNTER — Encounter: Payer: Self-pay | Admitting: Internal Medicine

## 2011-07-28 DIAGNOSIS — I421 Obstructive hypertrophic cardiomyopathy: Secondary | ICD-10-CM

## 2011-07-28 DIAGNOSIS — I5032 Chronic diastolic (congestive) heart failure: Secondary | ICD-10-CM

## 2011-07-28 DIAGNOSIS — I10 Essential (primary) hypertension: Secondary | ICD-10-CM

## 2011-07-28 DIAGNOSIS — Z950 Presence of cardiac pacemaker: Secondary | ICD-10-CM

## 2011-07-28 MED ORDER — FUROSEMIDE 20 MG PO TABS: 40.0000 mg | ORAL_TABLET | Freq: Every day | ORAL | Status: DC

## 2011-07-28 NOTE — Patient Instructions (Signed)
Increase Lasix to 40mg  daily.  If breathing does not get better with higher dose, decrease back to 20mg  daily.  Your physician wants you to follow-up in: 1 year with Dr. Ladona Ridgel.  You will receive a reminder letter in the mail two months in advance. If you don't receive a letter, please call our office to schedule the follow-up appointment.  Your physician wants you to follow-up in: the device clinic with Belenda Cruise and Gunnar Fusi in 6 months. You will receive a reminder letter in the mail two months in advance. If you don't receive a letter, please call our office to schedule the follow-up appointment.

## 2011-07-28 NOTE — Assessment & Plan Note (Signed)
Her blood pressure is well controlled. She is instructed to maintain a low-sodium diet. She will continue her current medical therapy.

## 2011-07-28 NOTE — Assessment & Plan Note (Addendum)
Her symptoms are class II. She will continue her current medical therapy and maintain a low-sodium diet. I've asked the patient to increase her Lasix dose to 40 mg a day.

## 2011-07-28 NOTE — Assessment & Plan Note (Signed)
Her device is working normally. We'll plan to recheck in several months. 

## 2011-07-28 NOTE — Progress Notes (Signed)
HPI [name] returns today for followup. She is a very pleasant 78 year old woman with multiple medical problems who returns today for followup. She has mild dyspnea. No chest pain. No peripheral edema. No syncope. She has no palpitations. Allergies  Allergen Reactions  . Ace Inhibitors   . Penicillins      Current Outpatient Prescriptions  Medication Sig Dispense Refill  . albuterol (VENTOLIN HFA) 108 (90 BASE) MCG/ACT inhaler Inhale 2 puffs into the lungs every 4 (four) hours as needed.        Marland Kitchen amLODipine (NORVASC) 10 MG tablet Take 10 mg by mouth daily.        Marland Kitchen aspirin 81 MG tablet Take 81 mg by mouth daily.        . ferrous sulfate 325 (65 FE) MG tablet Take 325 mg by mouth 2 (two) times daily.        . furosemide (LASIX) 20 MG tablet Take 2 tablets (40 mg total) by mouth daily.  60 tablet  3  . insulin aspart (NOVOLOG) 100 UNIT/ML injection Inject into the skin. 15units in the am 12 units in the pm      . metoprolol tartrate (LOPRESSOR) 25 MG tablet Take 1 tablet (25 mg total) by mouth 2 (two) times daily.  60 tablet  6  . nitroGLYCERIN (NITROSTAT) 0.4 MG SL tablet Place 0.4 mg under the tongue every 5 (five) minutes as needed.        . pantoprazole (PROTONIX) 40 MG tablet Take 40 mg by mouth daily.        . traMADol (ULTRAM) 50 MG tablet Take 50 mg by mouth every 6 (six) hours as needed. 1-2 tablets every 6 hours as needed      . DISCONTD: furosemide (LASIX) 20 MG tablet Take 20 mg by mouth daily.           Past Medical History  Diagnosis Date  . Coronary atherosclerosis of autologous vein bypass graft   . 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 Unspecified essential hypertension   . Dyslipidemia   . Type II or unspecified type diabetes mellitus without mention of complication, not stated as uncontrolled   . DJD (degenerative joint disease)   . COPD (chronic obstructive pulmonary disease)     ROS:   All systems reviewed and  negative except as noted in the HPI.   Past Surgical History  Procedure Date  . Coronary artery bypass graft     x4 in 1998. She had LIMA to LAD, saphenous vein graft to PDA, saphenous vein graft to diagonal #1, and saphenous vein graft to OM-1.      No family history on file.   History   Social History  . Marital Status: Widowed    Spouse Name: N/A    Number of Children: N/A  . Years of Education: N/A   Occupational History  . Not on file.   Social History Main Topics  . Smoking status: Former Games developer  . Smokeless tobacco: Not on file   Comment: quit in 1998. she had smoked 1.5 ppd for 30 years   . Alcohol Use: No  . Drug Use: No  . Sexually Active: Not on file   Other Topics Concern  . Not on file   Social History Narrative   Resides in [place] alone. Retired. Divorced. No exercisePpm- [employer]      BP 110/70  Pulse 80  Ht 5\' 1"  (1.549 m)  Wt 137 lb 12.8 oz (62.506 kg)  BMI 26.04 kg/m2  Physical Exam:  Well appearing elderly woman, NAD HEENT: Unremarkable Neck: 7 cm jugular venous distention Lungs:  Clear with no wheezes, rales, or rhonchi. HEART:  Regular rate rhythm, no murmurs, no rubs, no clicks Abd:  soft, positive bowel sounds, no organomegally, no rebound, no guarding Ext:  2 plus pulses, no edema, no cyanosis, no clubbing Skin:  No rashes no nodules Neuro:  CN II through XII intact, motor grossly intact  DEVICE  Normal device function.  See PaceArt for details.   Assess/Plan:

## 2011-07-31 LAB — PACEMAKER DEVICE OBSERVATION
AL AMPLITUDE: 2 mv
BAMS-0001: 175 {beats}/min
DEVICE MODEL PM: 246757
RV LEAD AMPLITUDE: 8 mv
RV LEAD THRESHOLD: 0.5 V

## 2011-09-10 ENCOUNTER — Ambulatory Visit (HOSPITAL_COMMUNITY): Payer: PRIVATE HEALTH INSURANCE | Attending: Cardiovascular Disease | Admitting: Radiology

## 2011-09-10 DIAGNOSIS — I25810 Atherosclerosis of coronary artery bypass graft(s) without angina pectoris: Secondary | ICD-10-CM

## 2011-09-10 DIAGNOSIS — I10 Essential (primary) hypertension: Secondary | ICD-10-CM | POA: Insufficient documentation

## 2011-09-10 DIAGNOSIS — J449 Chronic obstructive pulmonary disease, unspecified: Secondary | ICD-10-CM | POA: Insufficient documentation

## 2011-09-10 DIAGNOSIS — J4489 Other specified chronic obstructive pulmonary disease: Secondary | ICD-10-CM | POA: Insufficient documentation

## 2011-09-10 DIAGNOSIS — I080 Rheumatic disorders of both mitral and aortic valves: Secondary | ICD-10-CM | POA: Insufficient documentation

## 2011-09-10 DIAGNOSIS — I079 Rheumatic tricuspid valve disease, unspecified: Secondary | ICD-10-CM | POA: Insufficient documentation

## 2011-09-10 DIAGNOSIS — E119 Type 2 diabetes mellitus without complications: Secondary | ICD-10-CM | POA: Insufficient documentation

## 2011-09-10 DIAGNOSIS — I509 Heart failure, unspecified: Secondary | ICD-10-CM | POA: Insufficient documentation

## 2011-09-10 DIAGNOSIS — I319 Disease of pericardium, unspecified: Secondary | ICD-10-CM | POA: Insufficient documentation

## 2011-09-10 DIAGNOSIS — R0602 Shortness of breath: Secondary | ICD-10-CM | POA: Insufficient documentation

## 2011-09-10 DIAGNOSIS — I359 Nonrheumatic aortic valve disorder, unspecified: Secondary | ICD-10-CM

## 2011-09-10 NOTE — Progress Notes (Signed)
Echocardiogram performed.  

## 2011-10-17 ENCOUNTER — Other Ambulatory Visit: Payer: Self-pay | Admitting: Cardiovascular Disease

## 2012-02-11 ENCOUNTER — Encounter: Payer: PRIVATE HEALTH INSURANCE | Admitting: Cardiology

## 2012-02-16 ENCOUNTER — Ambulatory Visit (INDEPENDENT_AMBULATORY_CARE_PROVIDER_SITE_OTHER): Payer: PRIVATE HEALTH INSURANCE | Admitting: Cardiology

## 2012-02-16 ENCOUNTER — Encounter: Payer: Self-pay | Admitting: Internal Medicine

## 2012-02-16 DIAGNOSIS — I442 Atrioventricular block, complete: Secondary | ICD-10-CM

## 2012-02-16 DIAGNOSIS — Z950 Presence of cardiac pacemaker: Secondary | ICD-10-CM

## 2012-02-16 LAB — PACEMAKER DEVICE OBSERVATION
AL AMPLITUDE: 2.8 mv
AL IMPEDENCE PM: 493 Ohm
BAMS-0001: 175 {beats}/min
BATTERY VOLTAGE: 2.8 V
RV LEAD AMPLITUDE: 11.2 mv
RV LEAD IMPEDENCE PM: 570 Ohm
VENTRICULAR PACING PM: 35.5

## 2012-02-16 NOTE — Progress Notes (Signed)
PPM check/device clinic only. See PaceArt report. 

## 2012-02-16 NOTE — Patient Instructions (Addendum)
Your physician wants you to follow-up in: 6 months with Dr Taylor You will receive a reminder letter in the mail two months in advance. If you don't receive a letter, please call our office to schedule the follow-up appointment.  

## 2012-04-20 ENCOUNTER — Other Ambulatory Visit: Payer: Self-pay | Admitting: Nephrology

## 2012-04-20 DIAGNOSIS — N184 Chronic kidney disease, stage 4 (severe): Secondary | ICD-10-CM

## 2012-04-26 ENCOUNTER — Ambulatory Visit
Admission: RE | Admit: 2012-04-26 | Discharge: 2012-04-26 | Disposition: A | Discharge status: Home / Self Care | Payer: PRIVATE HEALTH INSURANCE | Source: Ambulatory Visit | Attending: Nephrology | Admitting: Nephrology

## 2012-04-26 DIAGNOSIS — N184 Chronic kidney disease, stage 4 (severe): Secondary | ICD-10-CM

## 2012-10-06 ENCOUNTER — Ambulatory Visit (INDEPENDENT_AMBULATORY_CARE_PROVIDER_SITE_OTHER): Payer: Medicaid Other | Admitting: Internal Medicine

## 2012-10-06 ENCOUNTER — Encounter: Payer: Self-pay | Admitting: Internal Medicine

## 2012-10-06 VITALS — BP 122/67 | HR 66 | Ht 62.0 in | Wt 123.0 lb

## 2012-10-06 DIAGNOSIS — I442 Atrioventricular block, complete: Secondary | ICD-10-CM

## 2012-10-06 LAB — PACEMAKER DEVICE OBSERVATION
AL AMPLITUDE: 2.8 mv
ATRIAL PACING PM: 2
BAMS-0001: 175 {beats}/min
DEVICE MODEL PM: 246757
RV LEAD THRESHOLD: 0.5 V
VENTRICULAR PACING PM: 52

## 2012-10-06 NOTE — Patient Instructions (Addendum)
Your physician wants you to follow-up in: 6 months Device Clinic with Gunnar Fusi and [place]. You will receive a reminder letter in the mail two months in advance. If you don't receive a letter, please call our office to schedule the follow-up appointment.  Your physician wants you to follow-up in: 12 months with Dr. Ladona Ridgel. You will receive a reminder letter in the mail two months in advance. If you don't receive a letter, please call our office to schedule the follow-up appointment.  Your physician recommends that you continue on your current medications as directed. Please refer to the Current Medication list given to you today.

## 2012-10-06 NOTE — Progress Notes (Signed)
HPI [name] returns today for followup. She is a very pleasant 79 year old woman with coronary artery disease, status post bypass surgery, hypertension, symptomatic bradycardia, diabetes, status post pacemaker insertion. In the interim, she has done well. She denies chest pain, shortness of breath, or syncope. She has had one fall, but denies loss of consciousness. No peripheral edema. Allergies  Allergen Reactions  . Ace Inhibitors   . Penicillins      Current Outpatient Prescriptions  Medication Sig Dispense Refill  . albuterol (VENTOLIN HFA) 108 (90 BASE) MCG/ACT inhaler Inhale 2 puffs into the lungs every 4 (four) hours as needed.        Marland Kitchen allopurinol (ZYLOPRIM) 100 MG tablet 1 tab daily      . amLODipine (NORVASC) 10 MG tablet Take 10 mg by mouth daily.        Marland Kitchen aspirin 81 MG tablet Take 81 mg by mouth daily.        . calcitRIOL (ROCALTROL) 0.25 MCG capsule 1 tab daily      . ferrous sulfate 325 (65 FE) MG tablet Take 325 mg by mouth 2 (two) times daily.        . furosemide (LASIX) 20 MG tablet Take 2 tablets (40 mg total) by mouth daily.  60 tablet  3  . insulin aspart (NOVOLOG) 100 UNIT/ML injection Inject into the skin. 15units in the am 12 units in the pm      . metoprolol tartrate (LOPRESSOR) 25 MG tablet TAKE 1 TABLET BY MOUTH TWICE A DAY  60 tablet  6  . nitroGLYCERIN (NITROSTAT) 0.4 MG SL tablet Place 0.4 mg under the tongue every 5 (five) minutes as needed.        . pantoprazole (PROTONIX) 40 MG tablet Take 40 mg by mouth daily.        . traMADol (ULTRAM) 50 MG tablet Take 50 mg by mouth every 6 (six) hours as needed. 1-2 tablets every 6 hours as needed       No current facility-administered medications for this visit.     Past Medical History  Diagnosis Date  . Coronary atherosclerosis of autologous vein bypass graft   . 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 Unspecified essential hypertension   . Dyslipidemia   . Type II or  unspecified type diabetes mellitus without mention of complication, not stated as uncontrolled   . DJD (degenerative joint disease)   . COPD (chronic obstructive pulmonary disease)     ROS:   All systems reviewed and negative except as noted in the HPI.   Past Surgical History  Procedure Laterality Date  . Coronary artery bypass graft      x4 in 1998. She had LIMA to LAD, saphenous vein graft to PDA, saphenous vein graft to diagonal #1, and saphenous vein graft to OM-1.      History reviewed. No pertinent family history.   History   Social History  . Marital Status: Widowed    Spouse Name: N/A    Number of Children: N/A  . Years of Education: N/A   Occupational History  . Not on file.   Social History Main Topics  . Smoking status: Former Games developer  . Smokeless tobacco: Not on file     Comment: quit in 1998. she had smoked 1.5 ppd for 30 years   . Alcohol Use: No  . Drug Use: No  . Sexual Activity: Not on file   Other Topics  Concern  . Not on file   Social History Narrative   Resides in [place] alone. Retired. Divorced. No exercise   Ppm- [employer]      BP 122/67  Pulse 66  Ht 5\' 2"  (1.575 m)  Wt 123 lb (55.792 kg)  BMI 22.49 kg/m2  Physical Exam:  Well appearing 79 year old woman, NAD HEENT: Unremarkable Neck:  No JVD, no thyromegally Back:  No CVA tenderness Lungs:  Clear with no wheezes, rales, or rhonchi. HEART:  Regular rate rhythm, no murmurs, no rubs, no clicks Abd:  soft, positive bowel sounds, no organomegally, no rebound, no guarding Ext:  2 plus pulses, no edema, no cyanosis, no clubbing Skin:  No rashes no nodules Neuro:  CN II through XII intact, motor grossly intact   DEVICE  Normal device function.  See PaceArt for details.   Assess/Plan:

## 2012-10-10 ENCOUNTER — Encounter: Payer: Self-pay | Admitting: Cardiology

## 2013-02-13 ENCOUNTER — Other Ambulatory Visit (HOSPITAL_COMMUNITY): Payer: Self-pay | Admitting: *Deleted

## 2013-02-14 ENCOUNTER — Encounter (HOSPITAL_COMMUNITY)
Admission: RE | Admit: 2013-02-14 | Discharge: 2013-02-14 | Disposition: A | Discharge status: Home / Self Care | Payer: PRIVATE HEALTH INSURANCE | Source: Ambulatory Visit | Attending: Nephrology | Admitting: Nephrology

## 2013-02-14 DIAGNOSIS — E1129 Type 2 diabetes mellitus with other diabetic kidney complication: Secondary | ICD-10-CM | POA: Insufficient documentation

## 2013-02-14 DIAGNOSIS — D631 Anemia in chronic kidney disease: Secondary | ICD-10-CM | POA: Insufficient documentation

## 2013-02-14 DIAGNOSIS — N184 Chronic kidney disease, stage 4 (severe): Secondary | ICD-10-CM | POA: Insufficient documentation

## 2013-02-14 DIAGNOSIS — N039 Chronic nephritic syndrome with unspecified morphologic changes: Secondary | ICD-10-CM | POA: Diagnosis present

## 2013-02-14 MED ORDER — SODIUM CHLORIDE 0.9 % IV SOLN
1020.0000 mg | Freq: Once | INTRAVENOUS | Status: AC
  Administered 2013-02-14: 1020 mg via INTRAVENOUS
  Filled 2013-02-14: qty 34

## 2013-04-05 ENCOUNTER — Encounter: Payer: Self-pay | Admitting: Internal Medicine

## 2013-04-05 ENCOUNTER — Ambulatory Visit (INDEPENDENT_AMBULATORY_CARE_PROVIDER_SITE_OTHER): Payer: PRIVATE HEALTH INSURANCE | Admitting: *Deleted

## 2013-04-05 DIAGNOSIS — I442 Atrioventricular block, complete: Secondary | ICD-10-CM

## 2013-04-05 LAB — MDC_IDC_ENUM_SESS_TYPE_INCLINIC
Battery Impedance: 343 Ohm
Battery Voltage: 2.8 V
Brady Statistic AP VS Percent: 0 %
Date Time Interrogation Session: 20150318101855
Lead Channel Impedance Value: 508 Ohm
Lead Channel Pacing Threshold Pulse Width: 0.4 ms
Lead Channel Pacing Threshold Pulse Width: 0.4 ms
Lead Channel Sensing Intrinsic Amplitude: 2 mV
Lead Channel Setting Pacing Amplitude: 2.5 V
Lead Channel Setting Sensing Sensitivity: 2.8 mV
MDC IDC MSMT BATTERY REMAINING LONGEVITY: 91 mo
MDC IDC MSMT LEADCHNL RA PACING THRESHOLD AMPLITUDE: 0.5 V
MDC IDC MSMT LEADCHNL RV IMPEDANCE VALUE: 560 Ohm
MDC IDC MSMT LEADCHNL RV PACING THRESHOLD AMPLITUDE: 0.5 V
MDC IDC MSMT LEADCHNL RV SENSING INTR AMPL: 4 mV
MDC IDC SET LEADCHNL RA PACING AMPLITUDE: 2 V
MDC IDC SET LEADCHNL RV PACING PULSEWIDTH: 0.4 ms
MDC IDC STAT BRADY AP VP PERCENT: 1 %
MDC IDC STAT BRADY AS VP PERCENT: 81 %
MDC IDC STAT BRADY AS VS PERCENT: 18 %

## 2013-04-07 NOTE — Progress Notes (Signed)
Pacemaker check in clinic. Normal device function. Thresholds, sensing, impedances consistent with previous measurements. Device programmed to maximize longevity. No mode switch episodes recorded. 1 high ventricular rate noted x 3 sec @ 90/180---AT. Device programmed at appropriate safety margins. Histogram distribution appropriate for patient activity level. Device programmed to optimize intrinsic conduction. Estimated longevity 7.5 years. Patient will follow up with [facility] in 6 months.

## 2013-06-28 ENCOUNTER — Ambulatory Visit (INDEPENDENT_AMBULATORY_CARE_PROVIDER_SITE_OTHER): Payer: 59

## 2013-06-28 VITALS — BP 131/54 | HR 84 | Resp 16

## 2013-06-28 DIAGNOSIS — E1149 Type 2 diabetes mellitus with other diabetic neurological complication: Secondary | ICD-10-CM

## 2013-06-28 DIAGNOSIS — B351 Tinea unguium: Secondary | ICD-10-CM

## 2013-06-28 DIAGNOSIS — E1140 Type 2 diabetes mellitus with diabetic neuropathy, unspecified: Secondary | ICD-10-CM

## 2013-06-28 DIAGNOSIS — M79609 Pain in unspecified limb: Secondary | ICD-10-CM

## 2013-06-28 NOTE — Progress Notes (Signed)
   Subjective:    Patient ID: [name] [name], female    DOB: 1922-07-17, 80 y.o.   MRN: [id number]  HPI my toenails are long and they need to be trimmed up a little    Review of Systems  Eyes: Positive for itching.  Respiratory: Positive for shortness of breath and wheezing.   Musculoskeletal: Positive for back pain and gait problem.  All other systems reviewed and are negative.      Objective:   Physical Exam 80 year old demented female well-developed well-nourished most part oriented presents this time for nail care as posterior-superior trimmed to cut nails thick brittle crumbly orthotic discolored and incurvated in ingrowing hallux second third fourth and fifth left and second third fourth fifth right right hallux as previously been partially avulsed nails are painful tender and symptomatic both and debridement and with enclosed shoe wear and ambulation. X. line lower extremity objective findings as follows vascular status is diminished with pedal pulses DP plus one over 4 bilateral PT thready nonpalpable bilateral skin temperature is warm to cool turgor diminished there is no edema mild varicosities noted neurologically epicritic and proprioceptive sensations intact although diminished on Semmes Weinstein testing to forefoot and distal digits and plantar arch. There is normal plantar response DTRs not listed neurologically skin color pigment normal hair growth absent there is distal pterygium of nail growth underneath the nail plates to the left hallux being most involved fourth and fifth digits bilateral also painful tender symptomatic on debridement and these are treated with lumicain Neosporin and Band-Aid dressing particular to the left hallux. Orthopedic exam otherwise unremarkable rectus foot type mild semirigid digital contractures are noted no open wounds or ulcerations no secondary infections       Assessment & Plan:  Assessment diabetes with history peripheral neuropathy also  onychomycosis kyphosis friability discoloration and brittleness of nails 1 through 5 left 2 through 5 right nails are debrided at this time painful and symptomatic the presence of pain in symptomology as well diabetes and complications return for future palliative care on an as-needed basis suggest 3 month followup for mycotic and diabetic foot and nail care  Alvan Dame DPM

## 2013-06-28 NOTE — Patient Instructions (Signed)
Diabetes and Foot Care Diabetes may cause you to have problems because of poor blood supply (circulation) to your feet and legs. This may cause the skin on your feet to become thinner, break easier, and heal more slowly. Your skin may become dry, and the skin may peel and crack. You may also have nerve damage in your legs and feet causing decreased feeling in them. You may not notice minor injuries to your feet that could lead to infections or more serious problems. Taking care of your feet is one of the most important things you can do for yourself.  HOME CARE INSTRUCTIONS  Wear shoes at all times, even in the house. Do not go barefoot. Bare feet are easily injured.  Check your feet daily for blisters, cuts, and redness. If you cannot see the bottom of your feet, use a mirror or ask someone for help.  Wash your feet with warm water (do not use hot water) and mild soap. Then pat your feet and the areas between your toes until they are completely dry. Do not soak your feet as this can dry your skin.  Apply a moisturizing lotion or petroleum jelly (that does not contain alcohol and is unscented) to the skin on your feet and to dry, brittle toenails. Do not apply lotion between your toes.  Trim your toenails straight across. Do not dig under them or around the cuticle. File the edges of your nails with an emery board or nail file.  Do not cut corns or calluses or try to remove them with medicine.  Wear clean socks or stockings every day. Make sure they are not too tight. Do not wear knee-high stockings since they may decrease blood flow to your legs.  Wear shoes that fit properly and have enough cushioning. To break in new shoes, wear them for just a few hours a day. This prevents you from injuring your feet. Always look in your shoes before you put them on to be sure there are no objects inside.  Do not cross your legs. This may decrease the blood flow to your feet.  If you find a minor scrape,  cut, or break in the skin on your feet, keep it and the skin around it clean and dry. These areas may be cleansed with mild soap and water. Do not cleanse the area with peroxide, alcohol, or iodine.  When you remove an adhesive bandage, be sure not to damage the skin around it.  If you have a wound, look at it several times a day to make sure it is healing.  Do not use heating pads or hot water bottles. They may burn your skin. If you have lost feeling in your feet or legs, you may not know it is happening until it is too late.  Make sure your health care provider performs a complete foot exam at least annually or more often if you have foot problems. Report any cuts, sores, or bruises to your health care provider immediately. SEEK MEDICAL CARE IF:   You have an injury that is not healing.  You have cuts or breaks in the skin.  You have an ingrown nail.  You notice redness on your legs or feet.  You feel burning or tingling in your legs or feet.  You have pain or cramps in your legs and feet.  Your legs or feet are numb.  Your feet always feel cold. SEEK IMMEDIATE MEDICAL CARE IF:   There is increasing redness,   swelling, or pain in or around a wound.  There is a red line that goes up your leg.  Pus is coming from a wound.  You develop a fever or as directed by your health care provider.  You notice a bad smell coming from an ulcer or wound. Document Released: 01/03/2000 Document Revised: 09/07/2012 Document Reviewed: 06/14/2012 [employer] Patient Information 2014 [employer].  

## 2013-09-27 ENCOUNTER — Ambulatory Visit: Payer: 59

## 2013-10-03 ENCOUNTER — Other Ambulatory Visit: Payer: Self-pay

## 2013-10-09 ENCOUNTER — Ambulatory Visit (INDEPENDENT_AMBULATORY_CARE_PROVIDER_SITE_OTHER): Payer: PRIVATE HEALTH INSURANCE | Admitting: Internal Medicine

## 2013-10-09 ENCOUNTER — Encounter: Payer: Self-pay | Admitting: Internal Medicine

## 2013-10-09 VITALS — BP 132/56 | HR 85 | Ht 62.0 in | Wt 110.0 lb

## 2013-10-09 DIAGNOSIS — I442 Atrioventricular block, complete: Secondary | ICD-10-CM

## 2013-10-09 DIAGNOSIS — Z950 Presence of cardiac pacemaker: Secondary | ICD-10-CM

## 2013-10-09 DIAGNOSIS — I10 Essential (primary) hypertension: Secondary | ICD-10-CM

## 2013-10-09 DIAGNOSIS — I5032 Chronic diastolic (congestive) heart failure: Secondary | ICD-10-CM

## 2013-10-09 LAB — MDC_IDC_ENUM_SESS_TYPE_INCLINIC
Brady Statistic AP VS Percent: 0 %
Brady Statistic AS VS Percent: 8 %
Date Time Interrogation Session: 20150921094713
Lead Channel Impedance Value: 547 Ohm
Lead Channel Pacing Threshold Amplitude: 0.5 V
Lead Channel Pacing Threshold Pulse Width: 0.4 ms
Lead Channel Pacing Threshold Pulse Width: 0.4 ms
Lead Channel Setting Sensing Sensitivity: 2.8 mV
MDC IDC MSMT BATTERY IMPEDANCE: 442 Ohm
MDC IDC MSMT BATTERY REMAINING LONGEVITY: 82 mo
MDC IDC MSMT BATTERY VOLTAGE: 2.8 V
MDC IDC MSMT LEADCHNL RA IMPEDANCE VALUE: 487 Ohm
MDC IDC MSMT LEADCHNL RA PACING THRESHOLD AMPLITUDE: 0.75 V
MDC IDC MSMT LEADCHNL RA SENSING INTR AMPL: 2 mV
MDC IDC MSMT LEADCHNL RV SENSING INTR AMPL: 8 mV
MDC IDC SET LEADCHNL RA PACING AMPLITUDE: 2 V
MDC IDC SET LEADCHNL RV PACING AMPLITUDE: 2.5 V
MDC IDC SET LEADCHNL RV PACING PULSEWIDTH: 0.4 ms
MDC IDC STAT BRADY AP VP PERCENT: 2 %
MDC IDC STAT BRADY AS VP PERCENT: 90 %

## 2013-10-09 NOTE — Assessment & Plan Note (Signed)
Her Medtronic DDD PM is working normally. We'll plan to recheck in several months.

## 2013-10-09 NOTE — Assessment & Plan Note (Signed)
Her symptoms remain class II. No change in medical therapy. She has no significant peripheral edema.

## 2013-10-09 NOTE — Progress Notes (Signed)
HPI Mrs. Feeser returns today for followup. She is a very pleasant 80 year old woman with coronary artery disease, status post bypass surgery, hypertension, chronic diastolic heart failure, symptomatic bradycardia, diabetes, status post pacemaker insertion. In the interim, she has done well. She denies chest pain, shortness of breath, or syncope.  No peripheral edema. Allergies  Allergen Reactions  . Ace Inhibitors   . Penicillins      Current Outpatient Prescriptions  Medication Sig Dispense Refill  . albuterol (VENTOLIN HFA) 108 (90 BASE) MCG/ACT inhaler Inhale 2 puffs into the lungs every 4 (four) hours as needed.        Marland Kitchen allopurinol (ZYLOPRIM) 100 MG tablet 1 tab daily      . amLODipine (NORVASC) 10 MG tablet Take 10 mg by mouth daily.        Marland Kitchen aspirin 81 MG tablet Take 81 mg by mouth daily.        . calcitRIOL (ROCALTROL) 0.25 MCG capsule 1 tab daily      . ferrous sulfate 325 (65 FE) MG tablet Take 325 mg by mouth 2 (two) times daily.        . furosemide (LASIX) 20 MG tablet Take 2 tablets (40 mg total) by mouth daily.  60 tablet  3  . insulin aspart (NOVOLOG) 100 UNIT/ML injection Inject into the skin. 10units in the am 10 units in the pm      . metoprolol tartrate (LOPRESSOR) 25 MG tablet TAKE 1 TABLET BY MOUTH TWICE A DAY  60 tablet  6  . nitroGLYCERIN (NITROSTAT) 0.4 MG SL tablet Place 0.4 mg under the tongue every 5 (five) minutes as needed.        Marland Kitchen NOVOLIN 70/30 (70-30) 100 UNIT/ML injection       . traMADol (ULTRAM) 50 MG tablet Take 50 mg by mouth every 6 (six) hours as needed. 1-2 tablets every 6 hours as needed       No current facility-administered medications for this visit.     Past Medical History  Diagnosis Date  . Coronary atherosclerosis of autologous vein bypass graft   . 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 Unspecified essential hypertension   . Dyslipidemia   . Type II or unspecified type diabetes mellitus without  mention of complication, not stated as uncontrolled   . DJD (degenerative joint disease)   . COPD (chronic obstructive pulmonary disease)     ROS:   All systems reviewed and negative except as noted in the HPI.   Past Surgical History  Procedure Laterality Date  . Coronary artery bypass graft      x4 in 1998. She had LIMA to LAD, saphenous vein graft to PDA, saphenous vein graft to diagonal #1, and saphenous vein graft to OM-1.      History reviewed. No pertinent family history.   History   Social History  . Marital Status: Widowed    Spouse Name: N/A    Number of Children: N/A  . Years of Education: N/A   Occupational History  . Not on file.   Social History Main Topics  . Smoking status: Former Games developer  . Smokeless tobacco: Not on file     Comment: quit in 1998. she had smoked 1.5 ppd for 30 years   . Alcohol Use: No  . Drug Use: No  . Sexual Activity: Not on file   Other Topics Concern  . Not on file   Social History Narrative  Resides in [place] alone. Retired. Divorced. No exercise   Ppm- [employer]      BP 132/56  Pulse 85  Ht  (1.575 m)  Wt 110 lb (49.896 kg)  BMI 20.11 kg/m2  Physical Exam:  Well appearing 80 year old woman, NAD HEENT: Unremarkable Neck:  No JVD, no thyromegally Back:  No CVA tenderness Lungs:  Clear with no wheezes, rales, or rhonchi. HEART:  Regular rate rhythm, no murmurs, no rubs, no clicks Abd:  soft, positive bowel sounds, no organomegally, no rebound, no guarding Ext:  2 plus pulses, trace peripheral edema, no cyanosis, no clubbing Skin:  No rashes no nodules Neuro:  CN II through XII intact, motor grossly intact   DEVICE  Normal device function.  See PaceArt for details.   Assess/Plan:

## 2013-10-09 NOTE — Patient Instructions (Signed)
Your physician recommends that you continue on your current medications as directed. Please refer to the Current Medication list given to you today.  Your physician wants you to follow-up in: 6 months with device clinic.  You will receive a reminder letter in the mail two months in advance. If you don't receive a letter, please call our office to schedule the follow-up appointment.  Your physician wants you to follow-up in: 1 year with Dr. Taylor. You will receive a reminder letter in the mail two months in advance. If you don't receive a letter, please call our office to schedule the follow-up appointment.  

## 2013-10-09 NOTE — Assessment & Plan Note (Signed)
Her blood pressure is well controlled today. She will continue her current medical therapy, and maintain a low-sodium diet. 

## 2013-10-20 ENCOUNTER — Encounter: Payer: Self-pay | Admitting: Cardiology

## 2014-01-19 DIAGNOSIS — J449 Chronic obstructive pulmonary disease, unspecified: Secondary | ICD-10-CM | POA: Diagnosis not present

## 2014-01-22 DIAGNOSIS — J449 Chronic obstructive pulmonary disease, unspecified: Secondary | ICD-10-CM | POA: Diagnosis not present

## 2014-01-23 DIAGNOSIS — J449 Chronic obstructive pulmonary disease, unspecified: Secondary | ICD-10-CM | POA: Diagnosis not present

## 2014-01-24 DIAGNOSIS — J449 Chronic obstructive pulmonary disease, unspecified: Secondary | ICD-10-CM | POA: Diagnosis not present

## 2014-01-25 DIAGNOSIS — J449 Chronic obstructive pulmonary disease, unspecified: Secondary | ICD-10-CM | POA: Diagnosis not present

## 2014-01-26 DIAGNOSIS — J449 Chronic obstructive pulmonary disease, unspecified: Secondary | ICD-10-CM | POA: Diagnosis not present

## 2014-01-29 DIAGNOSIS — J449 Chronic obstructive pulmonary disease, unspecified: Secondary | ICD-10-CM | POA: Diagnosis not present

## 2014-01-30 DIAGNOSIS — J449 Chronic obstructive pulmonary disease, unspecified: Secondary | ICD-10-CM | POA: Diagnosis not present

## 2014-01-31 DIAGNOSIS — J449 Chronic obstructive pulmonary disease, unspecified: Secondary | ICD-10-CM | POA: Diagnosis not present

## 2014-02-01 DIAGNOSIS — J449 Chronic obstructive pulmonary disease, unspecified: Secondary | ICD-10-CM | POA: Diagnosis not present

## 2014-02-02 DIAGNOSIS — J449 Chronic obstructive pulmonary disease, unspecified: Secondary | ICD-10-CM | POA: Diagnosis not present

## 2014-02-05 DIAGNOSIS — J449 Chronic obstructive pulmonary disease, unspecified: Secondary | ICD-10-CM | POA: Diagnosis not present

## 2014-02-06 DIAGNOSIS — J449 Chronic obstructive pulmonary disease, unspecified: Secondary | ICD-10-CM | POA: Diagnosis not present

## 2014-02-07 DIAGNOSIS — J449 Chronic obstructive pulmonary disease, unspecified: Secondary | ICD-10-CM | POA: Diagnosis not present

## 2014-02-08 DIAGNOSIS — J449 Chronic obstructive pulmonary disease, unspecified: Secondary | ICD-10-CM | POA: Diagnosis not present

## 2014-02-09 DIAGNOSIS — J449 Chronic obstructive pulmonary disease, unspecified: Secondary | ICD-10-CM | POA: Diagnosis not present

## 2014-02-12 DIAGNOSIS — J449 Chronic obstructive pulmonary disease, unspecified: Secondary | ICD-10-CM | POA: Diagnosis not present

## 2014-02-13 DIAGNOSIS — J449 Chronic obstructive pulmonary disease, unspecified: Secondary | ICD-10-CM | POA: Diagnosis not present

## 2014-02-14 DIAGNOSIS — J449 Chronic obstructive pulmonary disease, unspecified: Secondary | ICD-10-CM | POA: Diagnosis not present

## 2014-02-15 DIAGNOSIS — J449 Chronic obstructive pulmonary disease, unspecified: Secondary | ICD-10-CM | POA: Diagnosis not present

## 2014-02-16 DIAGNOSIS — J449 Chronic obstructive pulmonary disease, unspecified: Secondary | ICD-10-CM | POA: Diagnosis not present

## 2014-02-19 DIAGNOSIS — J449 Chronic obstructive pulmonary disease, unspecified: Secondary | ICD-10-CM | POA: Diagnosis not present

## 2014-02-20 DIAGNOSIS — J449 Chronic obstructive pulmonary disease, unspecified: Secondary | ICD-10-CM | POA: Diagnosis not present

## 2014-02-21 DIAGNOSIS — J449 Chronic obstructive pulmonary disease, unspecified: Secondary | ICD-10-CM | POA: Diagnosis not present

## 2014-02-22 DIAGNOSIS — J449 Chronic obstructive pulmonary disease, unspecified: Secondary | ICD-10-CM | POA: Diagnosis not present

## 2014-02-23 DIAGNOSIS — J449 Chronic obstructive pulmonary disease, unspecified: Secondary | ICD-10-CM | POA: Diagnosis not present

## 2014-02-26 DIAGNOSIS — J449 Chronic obstructive pulmonary disease, unspecified: Secondary | ICD-10-CM | POA: Diagnosis not present

## 2014-02-27 DIAGNOSIS — J449 Chronic obstructive pulmonary disease, unspecified: Secondary | ICD-10-CM | POA: Diagnosis not present

## 2014-02-28 DIAGNOSIS — J449 Chronic obstructive pulmonary disease, unspecified: Secondary | ICD-10-CM | POA: Diagnosis not present

## 2014-03-01 DIAGNOSIS — E1139 Type 2 diabetes mellitus with other diabetic ophthalmic complication: Secondary | ICD-10-CM | POA: Diagnosis not present

## 2014-03-01 DIAGNOSIS — E780 Pure hypercholesterolemia: Secondary | ICD-10-CM | POA: Diagnosis not present

## 2014-03-01 DIAGNOSIS — J449 Chronic obstructive pulmonary disease, unspecified: Secondary | ICD-10-CM | POA: Diagnosis not present

## 2014-03-01 DIAGNOSIS — M109 Gout, unspecified: Secondary | ICD-10-CM | POA: Diagnosis not present

## 2014-03-02 DIAGNOSIS — J449 Chronic obstructive pulmonary disease, unspecified: Secondary | ICD-10-CM | POA: Diagnosis not present

## 2014-03-05 DIAGNOSIS — J449 Chronic obstructive pulmonary disease, unspecified: Secondary | ICD-10-CM | POA: Diagnosis not present

## 2014-03-06 DIAGNOSIS — J449 Chronic obstructive pulmonary disease, unspecified: Secondary | ICD-10-CM | POA: Diagnosis not present

## 2014-03-07 DIAGNOSIS — J449 Chronic obstructive pulmonary disease, unspecified: Secondary | ICD-10-CM | POA: Diagnosis not present

## 2014-03-08 DIAGNOSIS — J449 Chronic obstructive pulmonary disease, unspecified: Secondary | ICD-10-CM | POA: Diagnosis not present

## 2014-03-09 DIAGNOSIS — J449 Chronic obstructive pulmonary disease, unspecified: Secondary | ICD-10-CM | POA: Diagnosis not present

## 2014-03-12 DIAGNOSIS — J449 Chronic obstructive pulmonary disease, unspecified: Secondary | ICD-10-CM | POA: Diagnosis not present

## 2014-03-13 DIAGNOSIS — J449 Chronic obstructive pulmonary disease, unspecified: Secondary | ICD-10-CM | POA: Diagnosis not present

## 2014-03-14 DIAGNOSIS — J449 Chronic obstructive pulmonary disease, unspecified: Secondary | ICD-10-CM | POA: Diagnosis not present

## 2014-03-15 DIAGNOSIS — J449 Chronic obstructive pulmonary disease, unspecified: Secondary | ICD-10-CM | POA: Diagnosis not present

## 2014-03-16 DIAGNOSIS — J449 Chronic obstructive pulmonary disease, unspecified: Secondary | ICD-10-CM | POA: Diagnosis not present

## 2014-03-19 DIAGNOSIS — I10 Essential (primary) hypertension: Secondary | ICD-10-CM | POA: Diagnosis not present

## 2014-03-19 DIAGNOSIS — M109 Gout, unspecified: Secondary | ICD-10-CM | POA: Diagnosis not present

## 2014-03-19 DIAGNOSIS — N183 Chronic kidney disease, stage 3 (moderate): Secondary | ICD-10-CM | POA: Diagnosis not present

## 2014-03-19 DIAGNOSIS — J449 Chronic obstructive pulmonary disease, unspecified: Secondary | ICD-10-CM | POA: Diagnosis not present

## 2014-03-19 DIAGNOSIS — E780 Pure hypercholesterolemia: Secondary | ICD-10-CM | POA: Diagnosis not present

## 2014-03-19 DIAGNOSIS — E1139 Type 2 diabetes mellitus with other diabetic ophthalmic complication: Secondary | ICD-10-CM | POA: Diagnosis not present

## 2014-03-20 DIAGNOSIS — J449 Chronic obstructive pulmonary disease, unspecified: Secondary | ICD-10-CM | POA: Diagnosis not present

## 2014-03-21 DIAGNOSIS — J449 Chronic obstructive pulmonary disease, unspecified: Secondary | ICD-10-CM | POA: Diagnosis not present

## 2014-03-22 DIAGNOSIS — J449 Chronic obstructive pulmonary disease, unspecified: Secondary | ICD-10-CM | POA: Diagnosis not present

## 2014-03-23 DIAGNOSIS — J449 Chronic obstructive pulmonary disease, unspecified: Secondary | ICD-10-CM | POA: Diagnosis not present

## 2014-03-26 DIAGNOSIS — J449 Chronic obstructive pulmonary disease, unspecified: Secondary | ICD-10-CM | POA: Diagnosis not present

## 2014-03-27 DIAGNOSIS — J449 Chronic obstructive pulmonary disease, unspecified: Secondary | ICD-10-CM | POA: Diagnosis not present

## 2014-03-28 DIAGNOSIS — J449 Chronic obstructive pulmonary disease, unspecified: Secondary | ICD-10-CM | POA: Diagnosis not present

## 2014-03-29 DIAGNOSIS — J449 Chronic obstructive pulmonary disease, unspecified: Secondary | ICD-10-CM | POA: Diagnosis not present

## 2014-03-30 DIAGNOSIS — J449 Chronic obstructive pulmonary disease, unspecified: Secondary | ICD-10-CM | POA: Diagnosis not present

## 2014-04-02 DIAGNOSIS — J449 Chronic obstructive pulmonary disease, unspecified: Secondary | ICD-10-CM | POA: Diagnosis not present

## 2014-04-03 DIAGNOSIS — J449 Chronic obstructive pulmonary disease, unspecified: Secondary | ICD-10-CM | POA: Diagnosis not present

## 2014-04-04 DIAGNOSIS — J449 Chronic obstructive pulmonary disease, unspecified: Secondary | ICD-10-CM | POA: Diagnosis not present

## 2014-04-05 DIAGNOSIS — J449 Chronic obstructive pulmonary disease, unspecified: Secondary | ICD-10-CM | POA: Diagnosis not present

## 2014-04-06 DIAGNOSIS — J449 Chronic obstructive pulmonary disease, unspecified: Secondary | ICD-10-CM | POA: Diagnosis not present

## 2014-04-09 ENCOUNTER — Ambulatory Visit (INDEPENDENT_AMBULATORY_CARE_PROVIDER_SITE_OTHER): Payer: Medicare Other | Admitting: *Deleted

## 2014-04-09 ENCOUNTER — Other Ambulatory Visit: Payer: Self-pay | Admitting: Internal Medicine

## 2014-04-09 DIAGNOSIS — I442 Atrioventricular block, complete: Secondary | ICD-10-CM | POA: Diagnosis not present

## 2014-04-09 LAB — MDC_IDC_ENUM_SESS_TYPE_INCLINIC
Battery Impedance: 517 Ohm
Brady Statistic AP VP Percent: 1 %
Brady Statistic AP VS Percent: 0 %
Brady Statistic AS VP Percent: 99 %
Brady Statistic AS VS Percent: 0 %
Date Time Interrogation Session: 20160321103622
Lead Channel Impedance Value: 454 Ohm
Lead Channel Pacing Threshold Amplitude: 0.75 V
Lead Channel Pacing Threshold Pulse Width: 0.4 ms
Lead Channel Setting Pacing Amplitude: 2.5 V
MDC IDC MSMT BATTERY REMAINING LONGEVITY: 75 mo
MDC IDC MSMT BATTERY VOLTAGE: 2.79 V
MDC IDC MSMT LEADCHNL RA SENSING INTR AMPL: 1.4 mV
MDC IDC MSMT LEADCHNL RV IMPEDANCE VALUE: 522 Ohm
MDC IDC MSMT LEADCHNL RV PACING THRESHOLD AMPLITUDE: 0.75 V
MDC IDC MSMT LEADCHNL RV PACING THRESHOLD PULSEWIDTH: 0.4 ms
MDC IDC SET LEADCHNL RA PACING AMPLITUDE: 2 V
MDC IDC SET LEADCHNL RV PACING PULSEWIDTH: 0.4 ms
MDC IDC SET LEADCHNL RV SENSING SENSITIVITY: 2.8 mV

## 2014-04-09 NOTE — Progress Notes (Signed)
Pacemaker check in clinic. Normal device function. Thresholds, sensing, impedances consistent with previous measurements. Device programmed to maximize longevity. No mode switch or high ventricular rates noted. Device programmed at appropriate safety margins. Histogram distribution appropriate for patient activity level. Device programmed to optimize intrinsic conduction. Estimated longevity 6 years. Patient enrolled in remote follow-up/TTM's with [employer]. Plan to follow every 3 months remotely and see annually in office. Patient education completed.  ROV 6 months with Dr. Ladona Ridgelaylor.

## 2014-04-10 DIAGNOSIS — J449 Chronic obstructive pulmonary disease, unspecified: Secondary | ICD-10-CM | POA: Diagnosis not present

## 2014-04-11 DIAGNOSIS — J449 Chronic obstructive pulmonary disease, unspecified: Secondary | ICD-10-CM | POA: Diagnosis not present

## 2014-04-12 DIAGNOSIS — J449 Chronic obstructive pulmonary disease, unspecified: Secondary | ICD-10-CM | POA: Diagnosis not present

## 2014-04-13 DIAGNOSIS — J449 Chronic obstructive pulmonary disease, unspecified: Secondary | ICD-10-CM | POA: Diagnosis not present

## 2014-04-16 DIAGNOSIS — J449 Chronic obstructive pulmonary disease, unspecified: Secondary | ICD-10-CM | POA: Diagnosis not present

## 2014-04-17 DIAGNOSIS — J449 Chronic obstructive pulmonary disease, unspecified: Secondary | ICD-10-CM | POA: Diagnosis not present

## 2014-04-18 DIAGNOSIS — J449 Chronic obstructive pulmonary disease, unspecified: Secondary | ICD-10-CM | POA: Diagnosis not present

## 2014-04-19 ENCOUNTER — Encounter: Payer: Self-pay | Admitting: Internal Medicine

## 2014-04-19 DIAGNOSIS — J449 Chronic obstructive pulmonary disease, unspecified: Secondary | ICD-10-CM | POA: Diagnosis not present

## 2014-04-20 DIAGNOSIS — J449 Chronic obstructive pulmonary disease, unspecified: Secondary | ICD-10-CM | POA: Diagnosis not present

## 2014-04-23 DIAGNOSIS — J449 Chronic obstructive pulmonary disease, unspecified: Secondary | ICD-10-CM | POA: Diagnosis not present

## 2014-04-24 DIAGNOSIS — J449 Chronic obstructive pulmonary disease, unspecified: Secondary | ICD-10-CM | POA: Diagnosis not present

## 2014-04-25 DIAGNOSIS — J449 Chronic obstructive pulmonary disease, unspecified: Secondary | ICD-10-CM | POA: Diagnosis not present

## 2014-04-26 DIAGNOSIS — J449 Chronic obstructive pulmonary disease, unspecified: Secondary | ICD-10-CM | POA: Diagnosis not present

## 2014-04-27 DIAGNOSIS — J449 Chronic obstructive pulmonary disease, unspecified: Secondary | ICD-10-CM | POA: Diagnosis not present

## 2014-04-30 DIAGNOSIS — J449 Chronic obstructive pulmonary disease, unspecified: Secondary | ICD-10-CM | POA: Diagnosis not present

## 2014-05-01 DIAGNOSIS — J449 Chronic obstructive pulmonary disease, unspecified: Secondary | ICD-10-CM | POA: Diagnosis not present

## 2014-05-02 DIAGNOSIS — J449 Chronic obstructive pulmonary disease, unspecified: Secondary | ICD-10-CM | POA: Diagnosis not present

## 2014-05-03 DIAGNOSIS — J449 Chronic obstructive pulmonary disease, unspecified: Secondary | ICD-10-CM | POA: Diagnosis not present

## 2014-05-04 DIAGNOSIS — J449 Chronic obstructive pulmonary disease, unspecified: Secondary | ICD-10-CM | POA: Diagnosis not present

## 2014-05-07 DIAGNOSIS — J449 Chronic obstructive pulmonary disease, unspecified: Secondary | ICD-10-CM | POA: Diagnosis not present

## 2014-05-08 DIAGNOSIS — J449 Chronic obstructive pulmonary disease, unspecified: Secondary | ICD-10-CM | POA: Diagnosis not present

## 2014-05-09 DIAGNOSIS — J449 Chronic obstructive pulmonary disease, unspecified: Secondary | ICD-10-CM | POA: Diagnosis not present

## 2014-05-10 DIAGNOSIS — J449 Chronic obstructive pulmonary disease, unspecified: Secondary | ICD-10-CM | POA: Diagnosis not present

## 2014-05-11 DIAGNOSIS — J449 Chronic obstructive pulmonary disease, unspecified: Secondary | ICD-10-CM | POA: Diagnosis not present

## 2014-05-11 DIAGNOSIS — E1139 Type 2 diabetes mellitus with other diabetic ophthalmic complication: Secondary | ICD-10-CM | POA: Diagnosis not present

## 2014-05-14 DIAGNOSIS — J449 Chronic obstructive pulmonary disease, unspecified: Secondary | ICD-10-CM | POA: Diagnosis not present

## 2014-05-15 DIAGNOSIS — J449 Chronic obstructive pulmonary disease, unspecified: Secondary | ICD-10-CM | POA: Diagnosis not present

## 2014-05-16 DIAGNOSIS — J449 Chronic obstructive pulmonary disease, unspecified: Secondary | ICD-10-CM | POA: Diagnosis not present

## 2014-05-17 DIAGNOSIS — J449 Chronic obstructive pulmonary disease, unspecified: Secondary | ICD-10-CM | POA: Diagnosis not present

## 2014-05-21 DIAGNOSIS — J449 Chronic obstructive pulmonary disease, unspecified: Secondary | ICD-10-CM | POA: Diagnosis not present

## 2014-05-21 DIAGNOSIS — R600 Localized edema: Secondary | ICD-10-CM | POA: Diagnosis not present

## 2014-05-21 DIAGNOSIS — E1139 Type 2 diabetes mellitus with other diabetic ophthalmic complication: Secondary | ICD-10-CM | POA: Diagnosis not present

## 2014-05-22 DIAGNOSIS — J449 Chronic obstructive pulmonary disease, unspecified: Secondary | ICD-10-CM | POA: Diagnosis not present

## 2014-05-23 DIAGNOSIS — M150 Primary generalized (osteo)arthritis: Secondary | ICD-10-CM | POA: Diagnosis not present

## 2014-05-23 DIAGNOSIS — I2510 Atherosclerotic heart disease of native coronary artery without angina pectoris: Secondary | ICD-10-CM | POA: Diagnosis not present

## 2014-05-23 DIAGNOSIS — M5136 Other intervertebral disc degeneration, lumbar region: Secondary | ICD-10-CM | POA: Diagnosis not present

## 2014-05-23 DIAGNOSIS — I129 Hypertensive chronic kidney disease with stage 1 through stage 4 chronic kidney disease, or unspecified chronic kidney disease: Secondary | ICD-10-CM | POA: Diagnosis not present

## 2014-05-23 DIAGNOSIS — N184 Chronic kidney disease, stage 4 (severe): Secondary | ICD-10-CM | POA: Diagnosis not present

## 2014-05-23 DIAGNOSIS — N2581 Secondary hyperparathyroidism of renal origin: Secondary | ICD-10-CM | POA: Diagnosis not present

## 2014-05-23 DIAGNOSIS — E1129 Type 2 diabetes mellitus with other diabetic kidney complication: Secondary | ICD-10-CM | POA: Diagnosis not present

## 2014-05-23 DIAGNOSIS — J449 Chronic obstructive pulmonary disease, unspecified: Secondary | ICD-10-CM | POA: Diagnosis not present

## 2014-05-23 DIAGNOSIS — D631 Anemia in chronic kidney disease: Secondary | ICD-10-CM | POA: Diagnosis not present

## 2014-05-24 DIAGNOSIS — J449 Chronic obstructive pulmonary disease, unspecified: Secondary | ICD-10-CM | POA: Diagnosis not present

## 2014-05-25 DIAGNOSIS — J449 Chronic obstructive pulmonary disease, unspecified: Secondary | ICD-10-CM | POA: Diagnosis not present

## 2014-05-28 DIAGNOSIS — J449 Chronic obstructive pulmonary disease, unspecified: Secondary | ICD-10-CM | POA: Diagnosis not present

## 2014-05-29 DIAGNOSIS — J449 Chronic obstructive pulmonary disease, unspecified: Secondary | ICD-10-CM | POA: Diagnosis not present

## 2014-05-30 ENCOUNTER — Other Ambulatory Visit (HOSPITAL_COMMUNITY): Payer: Self-pay | Admitting: *Deleted

## 2014-05-30 DIAGNOSIS — J449 Chronic obstructive pulmonary disease, unspecified: Secondary | ICD-10-CM | POA: Diagnosis not present

## 2014-05-31 ENCOUNTER — Ambulatory Visit (HOSPITAL_COMMUNITY)
Admission: RE | Admit: 2014-05-31 | Discharge: 2014-05-31 | Disposition: A | Discharge status: Home / Self Care | Payer: Medicare Other | Source: Ambulatory Visit | Attending: Nephrology | Admitting: Nephrology

## 2014-05-31 DIAGNOSIS — D509 Iron deficiency anemia, unspecified: Secondary | ICD-10-CM | POA: Insufficient documentation

## 2014-05-31 DIAGNOSIS — J449 Chronic obstructive pulmonary disease, unspecified: Secondary | ICD-10-CM | POA: Diagnosis not present

## 2014-05-31 MED ORDER — SODIUM CHLORIDE 0.9 % IV SOLN
510.0000 mg | INTRAVENOUS | Status: DC
  Administered 2014-05-31: 510 mg via INTRAVENOUS
  Filled 2014-05-31: qty 17

## 2014-06-01 DIAGNOSIS — J449 Chronic obstructive pulmonary disease, unspecified: Secondary | ICD-10-CM | POA: Diagnosis not present

## 2014-06-04 DIAGNOSIS — J449 Chronic obstructive pulmonary disease, unspecified: Secondary | ICD-10-CM | POA: Diagnosis not present

## 2014-06-05 DIAGNOSIS — J449 Chronic obstructive pulmonary disease, unspecified: Secondary | ICD-10-CM | POA: Diagnosis not present

## 2014-06-06 DIAGNOSIS — J449 Chronic obstructive pulmonary disease, unspecified: Secondary | ICD-10-CM | POA: Diagnosis not present

## 2014-06-07 ENCOUNTER — Encounter (HOSPITAL_COMMUNITY): Payer: Medicare Other

## 2014-06-07 DIAGNOSIS — J449 Chronic obstructive pulmonary disease, unspecified: Secondary | ICD-10-CM | POA: Diagnosis not present

## 2014-06-08 DIAGNOSIS — J449 Chronic obstructive pulmonary disease, unspecified: Secondary | ICD-10-CM | POA: Diagnosis not present

## 2014-06-11 DIAGNOSIS — J449 Chronic obstructive pulmonary disease, unspecified: Secondary | ICD-10-CM | POA: Diagnosis not present

## 2014-06-12 DIAGNOSIS — J449 Chronic obstructive pulmonary disease, unspecified: Secondary | ICD-10-CM | POA: Diagnosis not present

## 2014-06-13 ENCOUNTER — Encounter (HOSPITAL_COMMUNITY)
Admission: RE | Admit: 2014-06-13 | Discharge: 2014-06-13 | Disposition: A | Discharge status: Home / Self Care | Payer: Medicare Other | Source: Ambulatory Visit | Attending: Nephrology | Admitting: Nephrology

## 2014-06-13 DIAGNOSIS — J449 Chronic obstructive pulmonary disease, unspecified: Secondary | ICD-10-CM | POA: Diagnosis not present

## 2014-06-13 DIAGNOSIS — D509 Iron deficiency anemia, unspecified: Secondary | ICD-10-CM | POA: Insufficient documentation

## 2014-06-13 MED ORDER — SODIUM CHLORIDE 0.9 % IV SOLN
510.0000 mg | INTRAVENOUS | Status: DC
  Administered 2014-06-13: 510 mg via INTRAVENOUS
  Filled 2014-06-13: qty 17

## 2014-06-14 DIAGNOSIS — J449 Chronic obstructive pulmonary disease, unspecified: Secondary | ICD-10-CM | POA: Diagnosis not present

## 2014-06-15 DIAGNOSIS — J449 Chronic obstructive pulmonary disease, unspecified: Secondary | ICD-10-CM | POA: Diagnosis not present

## 2014-06-18 DIAGNOSIS — J449 Chronic obstructive pulmonary disease, unspecified: Secondary | ICD-10-CM | POA: Diagnosis not present

## 2014-06-20 DIAGNOSIS — J449 Chronic obstructive pulmonary disease, unspecified: Secondary | ICD-10-CM | POA: Diagnosis not present

## 2014-06-21 DIAGNOSIS — J449 Chronic obstructive pulmonary disease, unspecified: Secondary | ICD-10-CM | POA: Diagnosis not present

## 2014-06-22 DIAGNOSIS — J449 Chronic obstructive pulmonary disease, unspecified: Secondary | ICD-10-CM | POA: Diagnosis not present

## 2014-06-23 DIAGNOSIS — R262 Difficulty in walking, not elsewhere classified: Secondary | ICD-10-CM | POA: Diagnosis not present

## 2014-06-23 DIAGNOSIS — M150 Primary generalized (osteo)arthritis: Secondary | ICD-10-CM | POA: Diagnosis not present

## 2014-06-23 DIAGNOSIS — M5136 Other intervertebral disc degeneration, lumbar region: Secondary | ICD-10-CM | POA: Diagnosis not present

## 2014-06-23 DIAGNOSIS — I2510 Atherosclerotic heart disease of native coronary artery without angina pectoris: Secondary | ICD-10-CM | POA: Diagnosis not present

## 2014-06-25 DIAGNOSIS — J449 Chronic obstructive pulmonary disease, unspecified: Secondary | ICD-10-CM | POA: Diagnosis not present

## 2014-06-26 DIAGNOSIS — J449 Chronic obstructive pulmonary disease, unspecified: Secondary | ICD-10-CM | POA: Diagnosis not present

## 2014-06-27 DIAGNOSIS — J449 Chronic obstructive pulmonary disease, unspecified: Secondary | ICD-10-CM | POA: Diagnosis not present

## 2014-06-28 DIAGNOSIS — J449 Chronic obstructive pulmonary disease, unspecified: Secondary | ICD-10-CM | POA: Diagnosis not present

## 2014-06-29 DIAGNOSIS — J449 Chronic obstructive pulmonary disease, unspecified: Secondary | ICD-10-CM | POA: Diagnosis not present

## 2014-07-02 DIAGNOSIS — J449 Chronic obstructive pulmonary disease, unspecified: Secondary | ICD-10-CM | POA: Diagnosis not present

## 2014-07-03 DIAGNOSIS — J449 Chronic obstructive pulmonary disease, unspecified: Secondary | ICD-10-CM | POA: Diagnosis not present

## 2014-07-04 DIAGNOSIS — J449 Chronic obstructive pulmonary disease, unspecified: Secondary | ICD-10-CM | POA: Diagnosis not present

## 2014-07-05 DIAGNOSIS — J449 Chronic obstructive pulmonary disease, unspecified: Secondary | ICD-10-CM | POA: Diagnosis not present

## 2014-07-06 DIAGNOSIS — J449 Chronic obstructive pulmonary disease, unspecified: Secondary | ICD-10-CM | POA: Diagnosis not present

## 2014-07-09 DIAGNOSIS — J449 Chronic obstructive pulmonary disease, unspecified: Secondary | ICD-10-CM | POA: Diagnosis not present

## 2014-07-10 DIAGNOSIS — J449 Chronic obstructive pulmonary disease, unspecified: Secondary | ICD-10-CM | POA: Diagnosis not present

## 2014-07-11 DIAGNOSIS — J449 Chronic obstructive pulmonary disease, unspecified: Secondary | ICD-10-CM | POA: Diagnosis not present

## 2014-07-12 DIAGNOSIS — J449 Chronic obstructive pulmonary disease, unspecified: Secondary | ICD-10-CM | POA: Diagnosis not present

## 2014-07-13 DIAGNOSIS — J449 Chronic obstructive pulmonary disease, unspecified: Secondary | ICD-10-CM | POA: Diagnosis not present

## 2014-07-16 DIAGNOSIS — J449 Chronic obstructive pulmonary disease, unspecified: Secondary | ICD-10-CM | POA: Diagnosis not present

## 2014-07-17 DIAGNOSIS — J449 Chronic obstructive pulmonary disease, unspecified: Secondary | ICD-10-CM | POA: Diagnosis not present

## 2014-07-20 DIAGNOSIS — J449 Chronic obstructive pulmonary disease, unspecified: Secondary | ICD-10-CM | POA: Diagnosis not present

## 2014-07-23 DIAGNOSIS — M150 Primary generalized (osteo)arthritis: Secondary | ICD-10-CM | POA: Diagnosis not present

## 2014-07-23 DIAGNOSIS — J449 Chronic obstructive pulmonary disease, unspecified: Secondary | ICD-10-CM | POA: Diagnosis not present

## 2014-07-23 DIAGNOSIS — M5136 Other intervertebral disc degeneration, lumbar region: Secondary | ICD-10-CM | POA: Diagnosis not present

## 2014-07-23 DIAGNOSIS — I2510 Atherosclerotic heart disease of native coronary artery without angina pectoris: Secondary | ICD-10-CM | POA: Diagnosis not present

## 2014-07-24 DIAGNOSIS — J449 Chronic obstructive pulmonary disease, unspecified: Secondary | ICD-10-CM | POA: Diagnosis not present

## 2014-07-25 DIAGNOSIS — J449 Chronic obstructive pulmonary disease, unspecified: Secondary | ICD-10-CM | POA: Diagnosis not present

## 2014-07-26 DIAGNOSIS — J449 Chronic obstructive pulmonary disease, unspecified: Secondary | ICD-10-CM | POA: Diagnosis not present

## 2014-07-27 DIAGNOSIS — J449 Chronic obstructive pulmonary disease, unspecified: Secondary | ICD-10-CM | POA: Diagnosis not present

## 2014-07-30 DIAGNOSIS — J449 Chronic obstructive pulmonary disease, unspecified: Secondary | ICD-10-CM | POA: Diagnosis not present

## 2014-07-31 DIAGNOSIS — J449 Chronic obstructive pulmonary disease, unspecified: Secondary | ICD-10-CM | POA: Diagnosis not present

## 2014-08-01 DIAGNOSIS — J449 Chronic obstructive pulmonary disease, unspecified: Secondary | ICD-10-CM | POA: Diagnosis not present

## 2014-08-02 DIAGNOSIS — I10 Essential (primary) hypertension: Secondary | ICD-10-CM | POA: Diagnosis not present

## 2014-08-02 DIAGNOSIS — E1122 Type 2 diabetes mellitus with diabetic chronic kidney disease: Secondary | ICD-10-CM | POA: Diagnosis not present

## 2014-08-02 DIAGNOSIS — Z9181 History of falling: Secondary | ICD-10-CM | POA: Diagnosis not present

## 2014-08-02 DIAGNOSIS — E1139 Type 2 diabetes mellitus with other diabetic ophthalmic complication: Secondary | ICD-10-CM | POA: Diagnosis not present

## 2014-08-02 DIAGNOSIS — Z1389 Encounter for screening for other disorder: Secondary | ICD-10-CM | POA: Diagnosis not present

## 2014-08-02 DIAGNOSIS — Z139 Encounter for screening, unspecified: Secondary | ICD-10-CM | POA: Diagnosis not present

## 2014-08-02 DIAGNOSIS — M109 Gout, unspecified: Secondary | ICD-10-CM | POA: Diagnosis not present

## 2014-08-02 DIAGNOSIS — E780 Pure hypercholesterolemia: Secondary | ICD-10-CM | POA: Diagnosis not present

## 2014-08-02 DIAGNOSIS — J449 Chronic obstructive pulmonary disease, unspecified: Secondary | ICD-10-CM | POA: Diagnosis not present

## 2014-08-03 DIAGNOSIS — J449 Chronic obstructive pulmonary disease, unspecified: Secondary | ICD-10-CM | POA: Diagnosis not present

## 2014-08-06 DIAGNOSIS — J449 Chronic obstructive pulmonary disease, unspecified: Secondary | ICD-10-CM | POA: Diagnosis not present

## 2014-08-07 DIAGNOSIS — E1139 Type 2 diabetes mellitus with other diabetic ophthalmic complication: Secondary | ICD-10-CM | POA: Diagnosis not present

## 2014-08-07 DIAGNOSIS — J449 Chronic obstructive pulmonary disease, unspecified: Secondary | ICD-10-CM | POA: Diagnosis not present

## 2014-08-08 DIAGNOSIS — J449 Chronic obstructive pulmonary disease, unspecified: Secondary | ICD-10-CM | POA: Diagnosis not present

## 2014-08-09 DIAGNOSIS — J449 Chronic obstructive pulmonary disease, unspecified: Secondary | ICD-10-CM | POA: Diagnosis not present

## 2014-08-10 DIAGNOSIS — J449 Chronic obstructive pulmonary disease, unspecified: Secondary | ICD-10-CM | POA: Diagnosis not present

## 2014-08-13 DIAGNOSIS — J449 Chronic obstructive pulmonary disease, unspecified: Secondary | ICD-10-CM | POA: Diagnosis not present

## 2014-08-14 DIAGNOSIS — J449 Chronic obstructive pulmonary disease, unspecified: Secondary | ICD-10-CM | POA: Diagnosis not present

## 2014-08-15 DIAGNOSIS — J449 Chronic obstructive pulmonary disease, unspecified: Secondary | ICD-10-CM | POA: Diagnosis not present

## 2014-08-16 DIAGNOSIS — J449 Chronic obstructive pulmonary disease, unspecified: Secondary | ICD-10-CM | POA: Diagnosis not present

## 2014-08-20 DIAGNOSIS — J449 Chronic obstructive pulmonary disease, unspecified: Secondary | ICD-10-CM | POA: Diagnosis not present

## 2014-08-21 DIAGNOSIS — J449 Chronic obstructive pulmonary disease, unspecified: Secondary | ICD-10-CM | POA: Diagnosis not present

## 2014-08-22 DIAGNOSIS — J449 Chronic obstructive pulmonary disease, unspecified: Secondary | ICD-10-CM | POA: Diagnosis not present

## 2014-08-23 DIAGNOSIS — J449 Chronic obstructive pulmonary disease, unspecified: Secondary | ICD-10-CM | POA: Diagnosis not present

## 2014-08-23 DIAGNOSIS — I2510 Atherosclerotic heart disease of native coronary artery without angina pectoris: Secondary | ICD-10-CM | POA: Diagnosis not present

## 2014-08-23 DIAGNOSIS — M5136 Other intervertebral disc degeneration, lumbar region: Secondary | ICD-10-CM | POA: Diagnosis not present

## 2014-08-23 DIAGNOSIS — M150 Primary generalized (osteo)arthritis: Secondary | ICD-10-CM | POA: Diagnosis not present

## 2014-08-24 DIAGNOSIS — J449 Chronic obstructive pulmonary disease, unspecified: Secondary | ICD-10-CM | POA: Diagnosis not present

## 2014-08-27 DIAGNOSIS — J449 Chronic obstructive pulmonary disease, unspecified: Secondary | ICD-10-CM | POA: Diagnosis not present

## 2014-08-28 DIAGNOSIS — J449 Chronic obstructive pulmonary disease, unspecified: Secondary | ICD-10-CM | POA: Diagnosis not present

## 2014-08-29 DIAGNOSIS — J449 Chronic obstructive pulmonary disease, unspecified: Secondary | ICD-10-CM | POA: Diagnosis not present

## 2014-08-30 DIAGNOSIS — J449 Chronic obstructive pulmonary disease, unspecified: Secondary | ICD-10-CM | POA: Diagnosis not present

## 2014-08-31 DIAGNOSIS — J449 Chronic obstructive pulmonary disease, unspecified: Secondary | ICD-10-CM | POA: Diagnosis not present

## 2014-09-03 DIAGNOSIS — J449 Chronic obstructive pulmonary disease, unspecified: Secondary | ICD-10-CM | POA: Diagnosis not present

## 2014-09-04 DIAGNOSIS — J449 Chronic obstructive pulmonary disease, unspecified: Secondary | ICD-10-CM | POA: Diagnosis not present

## 2014-09-05 DIAGNOSIS — J449 Chronic obstructive pulmonary disease, unspecified: Secondary | ICD-10-CM | POA: Diagnosis not present

## 2014-09-06 DIAGNOSIS — J449 Chronic obstructive pulmonary disease, unspecified: Secondary | ICD-10-CM | POA: Diagnosis not present

## 2014-09-07 DIAGNOSIS — J449 Chronic obstructive pulmonary disease, unspecified: Secondary | ICD-10-CM | POA: Diagnosis not present

## 2014-09-10 DIAGNOSIS — J449 Chronic obstructive pulmonary disease, unspecified: Secondary | ICD-10-CM | POA: Diagnosis not present

## 2014-09-11 DIAGNOSIS — J449 Chronic obstructive pulmonary disease, unspecified: Secondary | ICD-10-CM | POA: Diagnosis not present

## 2014-09-12 DIAGNOSIS — J449 Chronic obstructive pulmonary disease, unspecified: Secondary | ICD-10-CM | POA: Diagnosis not present

## 2014-09-13 DIAGNOSIS — J449 Chronic obstructive pulmonary disease, unspecified: Secondary | ICD-10-CM | POA: Diagnosis not present

## 2014-09-14 DIAGNOSIS — J449 Chronic obstructive pulmonary disease, unspecified: Secondary | ICD-10-CM | POA: Diagnosis not present

## 2014-09-17 DIAGNOSIS — J449 Chronic obstructive pulmonary disease, unspecified: Secondary | ICD-10-CM | POA: Diagnosis not present

## 2014-09-18 DIAGNOSIS — J449 Chronic obstructive pulmonary disease, unspecified: Secondary | ICD-10-CM | POA: Diagnosis not present

## 2014-09-19 DIAGNOSIS — J449 Chronic obstructive pulmonary disease, unspecified: Secondary | ICD-10-CM | POA: Diagnosis not present

## 2014-09-20 DIAGNOSIS — J449 Chronic obstructive pulmonary disease, unspecified: Secondary | ICD-10-CM | POA: Diagnosis not present

## 2014-09-21 DIAGNOSIS — J449 Chronic obstructive pulmonary disease, unspecified: Secondary | ICD-10-CM | POA: Diagnosis not present

## 2014-09-23 DIAGNOSIS — I2510 Atherosclerotic heart disease of native coronary artery without angina pectoris: Secondary | ICD-10-CM | POA: Diagnosis not present

## 2014-09-23 DIAGNOSIS — M5136 Other intervertebral disc degeneration, lumbar region: Secondary | ICD-10-CM | POA: Diagnosis not present

## 2014-09-23 DIAGNOSIS — M150 Primary generalized (osteo)arthritis: Secondary | ICD-10-CM | POA: Diagnosis not present

## 2014-09-24 DIAGNOSIS — J449 Chronic obstructive pulmonary disease, unspecified: Secondary | ICD-10-CM | POA: Diagnosis not present

## 2014-09-25 DIAGNOSIS — J449 Chronic obstructive pulmonary disease, unspecified: Secondary | ICD-10-CM | POA: Diagnosis not present

## 2014-09-26 DIAGNOSIS — J449 Chronic obstructive pulmonary disease, unspecified: Secondary | ICD-10-CM | POA: Diagnosis not present

## 2014-09-27 DIAGNOSIS — J449 Chronic obstructive pulmonary disease, unspecified: Secondary | ICD-10-CM | POA: Diagnosis not present

## 2014-09-28 DIAGNOSIS — J449 Chronic obstructive pulmonary disease, unspecified: Secondary | ICD-10-CM | POA: Diagnosis not present

## 2014-10-01 DIAGNOSIS — J449 Chronic obstructive pulmonary disease, unspecified: Secondary | ICD-10-CM | POA: Diagnosis not present

## 2014-10-02 DIAGNOSIS — J449 Chronic obstructive pulmonary disease, unspecified: Secondary | ICD-10-CM | POA: Diagnosis not present

## 2014-10-03 ENCOUNTER — Encounter: Payer: Self-pay | Admitting: Podiatry

## 2014-10-03 ENCOUNTER — Ambulatory Visit (INDEPENDENT_AMBULATORY_CARE_PROVIDER_SITE_OTHER): Payer: Medicare Other | Admitting: Podiatry

## 2014-10-03 DIAGNOSIS — B351 Tinea unguium: Secondary | ICD-10-CM

## 2014-10-03 DIAGNOSIS — J449 Chronic obstructive pulmonary disease, unspecified: Secondary | ICD-10-CM | POA: Diagnosis not present

## 2014-10-03 DIAGNOSIS — M79676 Pain in unspecified toe(s): Secondary | ICD-10-CM | POA: Diagnosis not present

## 2014-10-03 DIAGNOSIS — E1140 Type 2 diabetes mellitus with diabetic neuropathy, unspecified: Secondary | ICD-10-CM

## 2014-10-03 NOTE — Progress Notes (Signed)
Patient ID: [name] [name], female   DOB: 1922-10-08, 81 y.o.   MRN: [id number] HPI  Complaint:  Visit Type: Patient returns to my office for continued preventative foot care services. Complaint: Patient states" my nails have grown long and thick and become painful to walk and wear shoes" Patient has been diagnosed with DM . This patient  presents for preventative foot care services. No changes to ROS  Podiatric Exam: Vascular: dorsalis pedis and posterior tibial pulses are non-palpable. Capillary return is diminished. Cold feet noted.. Skin turgor WNL,   Sensorium: Normal Semmes Weinstein monofilament test. Normal tactile sensation bilaterally.  Nail Exam: Pt has thick disfigured discolored nails with subungual debris noted bilateral entire nail hallux through fifth toenails Ulcer Exam: There is no evidence of ulcer or pre-ulcerative changes or infection. Orthopedic Exam: Muscle tone and strength are WNL. No limitations in general ROM. No crepitus or effusions noted. Foot type and digits show no abnormalities. Bony prominences are unremarkable. Skin: No Porokeratosis. No infection or ulcers  Diagnosis:  Onychomycosis, Pain in right toe, pain in left toes  Treatment & Plan Procedures and Treatment: Consent by patient was obtained for treatment procedures. The patient understood the discussion of treatment and procedures well. All questions were answered thoroughly reviewed. Debridement of mycotic and hypertrophic toenails, 1 through 5 bilateral and clearing of subungual debris. No ulceration, no infection noted.  Return Visit-Office Procedure: Patient instructed to return to the office for a follow up visit 3 months for continued evaluation and treatment.

## 2014-10-04 DIAGNOSIS — R42 Dizziness and giddiness: Secondary | ICD-10-CM | POA: Diagnosis not present

## 2014-10-04 DIAGNOSIS — Z794 Long term (current) use of insulin: Secondary | ICD-10-CM | POA: Diagnosis not present

## 2014-10-04 DIAGNOSIS — I5030 Unspecified diastolic (congestive) heart failure: Secondary | ICD-10-CM | POA: Diagnosis not present

## 2014-10-04 DIAGNOSIS — Z950 Presence of cardiac pacemaker: Secondary | ICD-10-CM | POA: Diagnosis not present

## 2014-10-04 DIAGNOSIS — I421 Obstructive hypertrophic cardiomyopathy: Secondary | ICD-10-CM | POA: Diagnosis not present

## 2014-10-04 DIAGNOSIS — J449 Chronic obstructive pulmonary disease, unspecified: Secondary | ICD-10-CM | POA: Diagnosis not present

## 2014-10-04 DIAGNOSIS — I350 Nonrheumatic aortic (valve) stenosis: Secondary | ICD-10-CM | POA: Diagnosis not present

## 2014-10-04 DIAGNOSIS — Z951 Presence of aortocoronary bypass graft: Secondary | ICD-10-CM | POA: Diagnosis not present

## 2014-10-04 DIAGNOSIS — Z87891 Personal history of nicotine dependence: Secondary | ICD-10-CM | POA: Diagnosis not present

## 2014-10-04 DIAGNOSIS — Z7982 Long term (current) use of aspirin: Secondary | ICD-10-CM | POA: Diagnosis not present

## 2014-10-04 DIAGNOSIS — I2510 Atherosclerotic heart disease of native coronary artery without angina pectoris: Secondary | ICD-10-CM | POA: Diagnosis not present

## 2014-10-04 DIAGNOSIS — E119 Type 2 diabetes mellitus without complications: Secondary | ICD-10-CM | POA: Diagnosis not present

## 2014-10-04 DIAGNOSIS — I252 Old myocardial infarction: Secondary | ICD-10-CM | POA: Diagnosis not present

## 2014-10-05 DIAGNOSIS — J449 Chronic obstructive pulmonary disease, unspecified: Secondary | ICD-10-CM | POA: Diagnosis not present

## 2014-10-05 DIAGNOSIS — E162 Hypoglycemia, unspecified: Secondary | ICD-10-CM | POA: Diagnosis not present

## 2014-10-05 DIAGNOSIS — Z681 Body mass index (BMI) 19 or less, adult: Secondary | ICD-10-CM | POA: Diagnosis not present

## 2014-10-08 DIAGNOSIS — J449 Chronic obstructive pulmonary disease, unspecified: Secondary | ICD-10-CM | POA: Diagnosis not present

## 2014-10-09 DIAGNOSIS — J449 Chronic obstructive pulmonary disease, unspecified: Secondary | ICD-10-CM | POA: Diagnosis not present

## 2014-10-10 DIAGNOSIS — J449 Chronic obstructive pulmonary disease, unspecified: Secondary | ICD-10-CM | POA: Diagnosis not present

## 2014-10-11 DIAGNOSIS — J449 Chronic obstructive pulmonary disease, unspecified: Secondary | ICD-10-CM | POA: Diagnosis not present

## 2014-10-12 DIAGNOSIS — J449 Chronic obstructive pulmonary disease, unspecified: Secondary | ICD-10-CM | POA: Diagnosis not present

## 2014-10-15 DIAGNOSIS — J449 Chronic obstructive pulmonary disease, unspecified: Secondary | ICD-10-CM | POA: Diagnosis not present

## 2014-10-16 ENCOUNTER — Encounter: Payer: Self-pay | Admitting: Internal Medicine

## 2014-10-16 ENCOUNTER — Ambulatory Visit (INDEPENDENT_AMBULATORY_CARE_PROVIDER_SITE_OTHER): Payer: Medicare Other | Admitting: Internal Medicine

## 2014-10-16 VITALS — BP 126/56 | HR 82 | Ht 63.0 in | Wt 105.0 lb

## 2014-10-16 DIAGNOSIS — I442 Atrioventricular block, complete: Secondary | ICD-10-CM | POA: Diagnosis not present

## 2014-10-16 DIAGNOSIS — I5032 Chronic diastolic (congestive) heart failure: Secondary | ICD-10-CM | POA: Diagnosis not present

## 2014-10-16 DIAGNOSIS — J449 Chronic obstructive pulmonary disease, unspecified: Secondary | ICD-10-CM | POA: Diagnosis not present

## 2014-10-16 DIAGNOSIS — I10 Essential (primary) hypertension: Secondary | ICD-10-CM

## 2014-10-16 DIAGNOSIS — Z950 Presence of cardiac pacemaker: Secondary | ICD-10-CM | POA: Diagnosis not present

## 2014-10-16 LAB — CUP PACEART INCLINIC DEVICE CHECK
Battery Remaining Longevity: 65 mo
Brady Statistic AP VP Percent: 1 %
Brady Statistic AP VS Percent: 0 %
Brady Statistic AS VP Percent: 99 %
Lead Channel Impedance Value: 564 Ohm
Lead Channel Pacing Threshold Amplitude: 0.5 V
Lead Channel Setting Pacing Amplitude: 2 V
Lead Channel Setting Pacing Amplitude: 2.5 V
Lead Channel Setting Sensing Sensitivity: 2.8 mV
MDC IDC MSMT BATTERY IMPEDANCE: 694 Ohm
MDC IDC MSMT BATTERY VOLTAGE: 2.79 V
MDC IDC MSMT LEADCHNL RA IMPEDANCE VALUE: 493 Ohm
MDC IDC MSMT LEADCHNL RA PACING THRESHOLD AMPLITUDE: 0.75 V
MDC IDC MSMT LEADCHNL RA PACING THRESHOLD PULSEWIDTH: 0.4 ms
MDC IDC MSMT LEADCHNL RA SENSING INTR AMPL: 2 mV
MDC IDC MSMT LEADCHNL RV PACING THRESHOLD PULSEWIDTH: 0.4 ms
MDC IDC SESS DTM: 20160927102910
MDC IDC SET LEADCHNL RV PACING PULSEWIDTH: 0.4 ms
MDC IDC STAT BRADY AS VS PERCENT: 0 %

## 2014-10-16 NOTE — Progress Notes (Signed)
HPI [name] [name] returns today for followup. She is a very pleasant 81 year old woman with coronary artery disease, status post bypass surgery, hypertension, chronic diastolic heart failure, symptomatic bradycardia, diabetes, status post pacemaker insertion. In the interim, she has done well. She denies chest pain, shortness of breath, or syncope.  No peripheral edema. Her daughter notes that her appetite is reduced and she has lost weight. Allergies  Allergen Reactions  . Ace Inhibitors   . Penicillins      Current Outpatient Prescriptions  Medication Sig Dispense Refill  . albuterol (PROVENTIL) (2.5 MG/3ML) 0.083% nebulizer solution USE 1 VIAL IN NEBULZIER EVERY 4 TO 6 HOURS AS NEEDED FOR SHORTNESS OF BREATH OR WHEEZING  1  . allopurinol (ZYLOPRIM) 100 MG tablet Take 100 mg by mouth daily.     Marland Kitchen amLODipine (NORVASC) 10 MG tablet Take 10 mg by mouth daily.      Marland Kitchen aspirin 81 MG tablet Take 81 mg by mouth daily.      . calcitRIOL (ROCALTROL) 0.25 MCG capsule Take 0.25 mcg by mouth daily.     . ferrous sulfate 325 (65 FE) MG tablet Take 325 mg by mouth 2 (two) times daily.      . furosemide (LASIX) 20 MG tablet Take 2 tablets (40 mg total) by mouth daily. 60 tablet 3  . insulin aspart (NOVOLOG) 100 UNIT/ML injection Inject 5 units into the skin in the am 5 units in the pm    . metoprolol tartrate (LOPRESSOR) 25 MG tablet TAKE 1 TABLET BY MOUTH TWICE A DAY 60 tablet 6  . nitroGLYCERIN (NITROSTAT) 0.4 MG SL tablet Place 0.4 mg under the tongue every 5 (five) minutes x 3 doses as needed for chest pain.     Marland Kitchen NOVOLIN 70/30 (70-30) 100 UNIT/ML injection Inject 10 Units into the skin 2 (two) times daily with a meal.      No current facility-administered medications for this visit.     Past Medical History  Diagnosis Date  . Coronary atherosclerosis of autologous vein bypass graft   . 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 Unspecified essential  hypertension   . Dyslipidemia   . Type II or unspecified type diabetes mellitus without mention of complication, not stated as uncontrolled   . DJD (degenerative joint disease)   . COPD (chronic obstructive pulmonary disease)     ROS:   All systems reviewed and negative except as noted in the HPI.   Past Surgical History  Procedure Laterality Date  . Coronary artery bypass graft      x4 in 1998. She had LIMA to LAD, saphenous vein graft to PDA, saphenous vein graft to diagonal #1, and saphenous vein graft to OM-1.      Family History  Problem Relation Age of Onset  . Cancer Mother   . Heart Problems Brother   . Cancer Brother   . Heart attack Sister   . Heart attack Daughter   . Heart Problems Brother      Social History   Social History  . Marital Status: Widowed    Spouse Name: N/A  . Number of Children: N/A  . Years of Education: N/A   Occupational History  . Not on file.   Social History Main Topics  . Smoking status: Former Games developer  . Smokeless tobacco: Not on file     Comment: quit in 1998. she had smoked 1.5 ppd for 30 years   .  Alcohol Use: No  . Drug Use: No  . Sexual Activity: Not on file   Other Topics Concern  . Not on file   Social History Narrative   Resides in [place] alone. Retired. Divorced. No exercise   Ppm- [employer]      BP 126/56 mmHg  Pulse 82  Ht  (1.6 m)  Wt 105 lb (47.628 kg)  BMI 18.60 kg/m2  LMP  (LMP Unknown)  Physical Exam:  Well appearing 81 year old woman, NAD HEENT: Unremarkable Neck:  6 cm JVD, no thyromegally Back:  No CVA tenderness Lungs:  Clear with no wheezes, rales, or rhonchi. HEART:  Regular rate rhythm, no murmurs, no rubs, no clicks Abd:  soft, positive bowel sounds, no organomegally, no rebound, no guarding Ext:  2 plus pulses, trace peripheral edema, no cyanosis, no clubbing Skin:  No rashes no nodules Neuro:  CN II through XII intact, motor grossly intact   DEVICE  Normal device  function.  See PaceArt for details.   Assess/Plan:

## 2014-10-16 NOTE — Assessment & Plan Note (Signed)
Her symptoms are class 2. She will continue her current meds. 

## 2014-10-16 NOTE — Assessment & Plan Note (Signed)
Her blood pressure is well controlled. No change in medications. 

## 2014-10-16 NOTE — Patient Instructions (Addendum)
Medication Instructions:  Your physician recommends that you continue on your current medications as directed. Please refer to the Current Medication list given to you today.   Labwork: None ordered  Testing/Procedures: None ordered  Follow-Up: Your physician wants you to follow-up in: 6 months in the device clinic and 12 months with Dr Taylor You will receive a reminder letter in the mail two months in advance. If you don't receive a letter, please call our office to schedule the follow-up appointment.   Any Other Special Instructions Will Be Listed Below (If Applicable).   

## 2014-10-16 NOTE — Assessment & Plan Note (Signed)
Her [employer] DDD PM is working normally. Will recheck in several months. 

## 2014-10-17 DIAGNOSIS — J449 Chronic obstructive pulmonary disease, unspecified: Secondary | ICD-10-CM | POA: Diagnosis not present

## 2014-10-22 DIAGNOSIS — J449 Chronic obstructive pulmonary disease, unspecified: Secondary | ICD-10-CM | POA: Diagnosis not present

## 2014-10-23 DIAGNOSIS — I2510 Atherosclerotic heart disease of native coronary artery without angina pectoris: Secondary | ICD-10-CM | POA: Diagnosis not present

## 2014-10-23 DIAGNOSIS — M150 Primary generalized (osteo)arthritis: Secondary | ICD-10-CM | POA: Diagnosis not present

## 2014-10-23 DIAGNOSIS — J449 Chronic obstructive pulmonary disease, unspecified: Secondary | ICD-10-CM | POA: Diagnosis not present

## 2014-10-23 DIAGNOSIS — M5136 Other intervertebral disc degeneration, lumbar region: Secondary | ICD-10-CM | POA: Diagnosis not present

## 2014-10-24 DIAGNOSIS — E1139 Type 2 diabetes mellitus with other diabetic ophthalmic complication: Secondary | ICD-10-CM | POA: Diagnosis not present

## 2014-10-24 DIAGNOSIS — Z23 Encounter for immunization: Secondary | ICD-10-CM | POA: Diagnosis not present

## 2014-10-24 DIAGNOSIS — J449 Chronic obstructive pulmonary disease, unspecified: Secondary | ICD-10-CM | POA: Diagnosis not present

## 2014-10-24 DIAGNOSIS — Z6821 Body mass index (BMI) 21.0-21.9, adult: Secondary | ICD-10-CM | POA: Diagnosis not present

## 2014-10-25 DIAGNOSIS — J449 Chronic obstructive pulmonary disease, unspecified: Secondary | ICD-10-CM | POA: Diagnosis not present

## 2014-10-26 DIAGNOSIS — J449 Chronic obstructive pulmonary disease, unspecified: Secondary | ICD-10-CM | POA: Diagnosis not present

## 2014-10-26 DIAGNOSIS — Z794 Long term (current) use of insulin: Secondary | ICD-10-CM | POA: Diagnosis not present

## 2014-10-26 DIAGNOSIS — H2513 Age-related nuclear cataract, bilateral: Secondary | ICD-10-CM | POA: Diagnosis not present

## 2014-10-26 DIAGNOSIS — E139 Other specified diabetes mellitus without complications: Secondary | ICD-10-CM | POA: Diagnosis not present

## 2014-10-26 DIAGNOSIS — E1139 Type 2 diabetes mellitus with other diabetic ophthalmic complication: Secondary | ICD-10-CM | POA: Diagnosis not present

## 2014-10-29 DIAGNOSIS — J449 Chronic obstructive pulmonary disease, unspecified: Secondary | ICD-10-CM | POA: Diagnosis not present

## 2014-10-30 DIAGNOSIS — J449 Chronic obstructive pulmonary disease, unspecified: Secondary | ICD-10-CM | POA: Diagnosis not present

## 2014-10-31 DIAGNOSIS — J449 Chronic obstructive pulmonary disease, unspecified: Secondary | ICD-10-CM | POA: Diagnosis not present

## 2014-11-01 DIAGNOSIS — J449 Chronic obstructive pulmonary disease, unspecified: Secondary | ICD-10-CM | POA: Diagnosis not present

## 2014-11-02 DIAGNOSIS — J449 Chronic obstructive pulmonary disease, unspecified: Secondary | ICD-10-CM | POA: Diagnosis not present

## 2014-11-05 DIAGNOSIS — J449 Chronic obstructive pulmonary disease, unspecified: Secondary | ICD-10-CM | POA: Diagnosis not present

## 2014-11-06 DIAGNOSIS — J449 Chronic obstructive pulmonary disease, unspecified: Secondary | ICD-10-CM | POA: Diagnosis not present

## 2014-11-07 DIAGNOSIS — J449 Chronic obstructive pulmonary disease, unspecified: Secondary | ICD-10-CM | POA: Diagnosis not present

## 2014-11-08 DIAGNOSIS — J449 Chronic obstructive pulmonary disease, unspecified: Secondary | ICD-10-CM | POA: Diagnosis not present

## 2014-11-09 DIAGNOSIS — J449 Chronic obstructive pulmonary disease, unspecified: Secondary | ICD-10-CM | POA: Diagnosis not present

## 2014-11-12 DIAGNOSIS — J449 Chronic obstructive pulmonary disease, unspecified: Secondary | ICD-10-CM | POA: Diagnosis not present

## 2014-11-13 DIAGNOSIS — J449 Chronic obstructive pulmonary disease, unspecified: Secondary | ICD-10-CM | POA: Diagnosis not present

## 2014-11-14 DIAGNOSIS — J449 Chronic obstructive pulmonary disease, unspecified: Secondary | ICD-10-CM | POA: Diagnosis not present

## 2014-11-15 DIAGNOSIS — J449 Chronic obstructive pulmonary disease, unspecified: Secondary | ICD-10-CM | POA: Diagnosis not present

## 2014-11-16 DIAGNOSIS — J449 Chronic obstructive pulmonary disease, unspecified: Secondary | ICD-10-CM | POA: Diagnosis not present

## 2014-11-19 DIAGNOSIS — J449 Chronic obstructive pulmonary disease, unspecified: Secondary | ICD-10-CM | POA: Diagnosis not present

## 2014-11-20 DIAGNOSIS — J449 Chronic obstructive pulmonary disease, unspecified: Secondary | ICD-10-CM | POA: Diagnosis not present

## 2014-11-21 DIAGNOSIS — J449 Chronic obstructive pulmonary disease, unspecified: Secondary | ICD-10-CM | POA: Diagnosis not present

## 2014-11-22 DIAGNOSIS — J449 Chronic obstructive pulmonary disease, unspecified: Secondary | ICD-10-CM | POA: Diagnosis not present

## 2014-11-23 DIAGNOSIS — J449 Chronic obstructive pulmonary disease, unspecified: Secondary | ICD-10-CM | POA: Diagnosis not present

## 2014-11-23 DIAGNOSIS — M5136 Other intervertebral disc degeneration, lumbar region: Secondary | ICD-10-CM | POA: Diagnosis not present

## 2014-11-23 DIAGNOSIS — M150 Primary generalized (osteo)arthritis: Secondary | ICD-10-CM | POA: Diagnosis not present

## 2014-11-23 DIAGNOSIS — I2510 Atherosclerotic heart disease of native coronary artery without angina pectoris: Secondary | ICD-10-CM | POA: Diagnosis not present

## 2014-11-26 DIAGNOSIS — J449 Chronic obstructive pulmonary disease, unspecified: Secondary | ICD-10-CM | POA: Diagnosis not present

## 2014-11-27 DIAGNOSIS — J449 Chronic obstructive pulmonary disease, unspecified: Secondary | ICD-10-CM | POA: Diagnosis not present

## 2014-11-28 DIAGNOSIS — J449 Chronic obstructive pulmonary disease, unspecified: Secondary | ICD-10-CM | POA: Diagnosis not present

## 2014-11-29 DIAGNOSIS — J449 Chronic obstructive pulmonary disease, unspecified: Secondary | ICD-10-CM | POA: Diagnosis not present

## 2014-11-30 DIAGNOSIS — J449 Chronic obstructive pulmonary disease, unspecified: Secondary | ICD-10-CM | POA: Diagnosis not present

## 2014-12-03 DIAGNOSIS — J449 Chronic obstructive pulmonary disease, unspecified: Secondary | ICD-10-CM | POA: Diagnosis not present

## 2014-12-04 DIAGNOSIS — J449 Chronic obstructive pulmonary disease, unspecified: Secondary | ICD-10-CM | POA: Diagnosis not present

## 2014-12-05 DIAGNOSIS — J449 Chronic obstructive pulmonary disease, unspecified: Secondary | ICD-10-CM | POA: Diagnosis not present

## 2014-12-06 DIAGNOSIS — J449 Chronic obstructive pulmonary disease, unspecified: Secondary | ICD-10-CM | POA: Diagnosis not present

## 2014-12-07 DIAGNOSIS — H25813 Combined forms of age-related cataract, bilateral: Secondary | ICD-10-CM | POA: Diagnosis not present

## 2014-12-07 DIAGNOSIS — J449 Chronic obstructive pulmonary disease, unspecified: Secondary | ICD-10-CM | POA: Diagnosis not present

## 2014-12-10 DIAGNOSIS — E119 Type 2 diabetes mellitus without complications: Secondary | ICD-10-CM | POA: Diagnosis not present

## 2014-12-12 DIAGNOSIS — J449 Chronic obstructive pulmonary disease, unspecified: Secondary | ICD-10-CM | POA: Diagnosis not present

## 2014-12-20 DIAGNOSIS — H25812 Combined forms of age-related cataract, left eye: Secondary | ICD-10-CM | POA: Diagnosis not present

## 2014-12-20 DIAGNOSIS — H2512 Age-related nuclear cataract, left eye: Secondary | ICD-10-CM | POA: Diagnosis not present

## 2014-12-23 DIAGNOSIS — M150 Primary generalized (osteo)arthritis: Secondary | ICD-10-CM | POA: Diagnosis not present

## 2014-12-23 DIAGNOSIS — I2510 Atherosclerotic heart disease of native coronary artery without angina pectoris: Secondary | ICD-10-CM | POA: Diagnosis not present

## 2014-12-23 DIAGNOSIS — M5136 Other intervertebral disc degeneration, lumbar region: Secondary | ICD-10-CM | POA: Diagnosis not present

## 2015-01-07 ENCOUNTER — Ambulatory Visit: Payer: Medicare Other | Admitting: Podiatry

## 2015-01-18 DIAGNOSIS — E1139 Type 2 diabetes mellitus with other diabetic ophthalmic complication: Secondary | ICD-10-CM | POA: Diagnosis not present

## 2015-01-23 DIAGNOSIS — M150 Primary generalized (osteo)arthritis: Secondary | ICD-10-CM | POA: Diagnosis not present

## 2015-01-23 DIAGNOSIS — M5136 Other intervertebral disc degeneration, lumbar region: Secondary | ICD-10-CM | POA: Diagnosis not present

## 2015-01-23 DIAGNOSIS — I2510 Atherosclerotic heart disease of native coronary artery without angina pectoris: Secondary | ICD-10-CM | POA: Diagnosis not present

## 2015-01-23 DIAGNOSIS — R262 Difficulty in walking, not elsewhere classified: Secondary | ICD-10-CM | POA: Diagnosis not present

## 2015-02-11 DIAGNOSIS — J449 Chronic obstructive pulmonary disease, unspecified: Secondary | ICD-10-CM | POA: Diagnosis not present

## 2015-02-12 DIAGNOSIS — J449 Chronic obstructive pulmonary disease, unspecified: Secondary | ICD-10-CM | POA: Diagnosis not present

## 2015-02-13 DIAGNOSIS — J449 Chronic obstructive pulmonary disease, unspecified: Secondary | ICD-10-CM | POA: Diagnosis not present

## 2015-02-14 DIAGNOSIS — J449 Chronic obstructive pulmonary disease, unspecified: Secondary | ICD-10-CM | POA: Diagnosis not present

## 2015-02-15 DIAGNOSIS — J449 Chronic obstructive pulmonary disease, unspecified: Secondary | ICD-10-CM | POA: Diagnosis not present

## 2015-02-18 DIAGNOSIS — J449 Chronic obstructive pulmonary disease, unspecified: Secondary | ICD-10-CM | POA: Diagnosis not present

## 2015-02-19 DIAGNOSIS — J449 Chronic obstructive pulmonary disease, unspecified: Secondary | ICD-10-CM | POA: Diagnosis not present

## 2015-02-20 DIAGNOSIS — J449 Chronic obstructive pulmonary disease, unspecified: Secondary | ICD-10-CM | POA: Diagnosis not present

## 2015-02-21 DIAGNOSIS — J449 Chronic obstructive pulmonary disease, unspecified: Secondary | ICD-10-CM | POA: Diagnosis not present

## 2015-02-22 DIAGNOSIS — J449 Chronic obstructive pulmonary disease, unspecified: Secondary | ICD-10-CM | POA: Diagnosis not present

## 2015-02-23 DIAGNOSIS — I2510 Atherosclerotic heart disease of native coronary artery without angina pectoris: Secondary | ICD-10-CM | POA: Diagnosis not present

## 2015-02-23 DIAGNOSIS — M5136 Other intervertebral disc degeneration, lumbar region: Secondary | ICD-10-CM | POA: Diagnosis not present

## 2015-02-23 DIAGNOSIS — M150 Primary generalized (osteo)arthritis: Secondary | ICD-10-CM | POA: Diagnosis not present

## 2015-02-25 DIAGNOSIS — J449 Chronic obstructive pulmonary disease, unspecified: Secondary | ICD-10-CM | POA: Diagnosis not present

## 2015-02-26 DIAGNOSIS — J449 Chronic obstructive pulmonary disease, unspecified: Secondary | ICD-10-CM | POA: Diagnosis not present

## 2015-02-27 DIAGNOSIS — J449 Chronic obstructive pulmonary disease, unspecified: Secondary | ICD-10-CM | POA: Diagnosis not present

## 2015-02-28 DIAGNOSIS — J449 Chronic obstructive pulmonary disease, unspecified: Secondary | ICD-10-CM | POA: Diagnosis not present

## 2015-03-01 DIAGNOSIS — J449 Chronic obstructive pulmonary disease, unspecified: Secondary | ICD-10-CM | POA: Diagnosis not present

## 2015-03-04 DIAGNOSIS — E1129 Type 2 diabetes mellitus with other diabetic kidney complication: Secondary | ICD-10-CM | POA: Diagnosis not present

## 2015-03-04 DIAGNOSIS — N189 Chronic kidney disease, unspecified: Secondary | ICD-10-CM | POA: Diagnosis not present

## 2015-03-04 DIAGNOSIS — I129 Hypertensive chronic kidney disease with stage 1 through stage 4 chronic kidney disease, or unspecified chronic kidney disease: Secondary | ICD-10-CM | POA: Diagnosis not present

## 2015-03-04 DIAGNOSIS — D631 Anemia in chronic kidney disease: Secondary | ICD-10-CM | POA: Diagnosis not present

## 2015-03-04 DIAGNOSIS — N2581 Secondary hyperparathyroidism of renal origin: Secondary | ICD-10-CM | POA: Diagnosis not present

## 2015-03-04 DIAGNOSIS — J449 Chronic obstructive pulmonary disease, unspecified: Secondary | ICD-10-CM | POA: Diagnosis not present

## 2015-03-04 DIAGNOSIS — N184 Chronic kidney disease, stage 4 (severe): Secondary | ICD-10-CM | POA: Diagnosis not present

## 2015-03-05 DIAGNOSIS — J449 Chronic obstructive pulmonary disease, unspecified: Secondary | ICD-10-CM | POA: Diagnosis not present

## 2015-03-06 DIAGNOSIS — J440 Chronic obstructive pulmonary disease with acute lower respiratory infection: Secondary | ICD-10-CM | POA: Diagnosis not present

## 2015-03-06 DIAGNOSIS — Z87891 Personal history of nicotine dependence: Secondary | ICD-10-CM | POA: Diagnosis not present

## 2015-03-06 DIAGNOSIS — R7989 Other specified abnormal findings of blood chemistry: Secondary | ICD-10-CM | POA: Diagnosis not present

## 2015-03-06 DIAGNOSIS — J449 Chronic obstructive pulmonary disease, unspecified: Secondary | ICD-10-CM | POA: Diagnosis not present

## 2015-03-06 DIAGNOSIS — I5033 Acute on chronic diastolic (congestive) heart failure: Secondary | ICD-10-CM | POA: Diagnosis not present

## 2015-03-06 DIAGNOSIS — R05 Cough: Secondary | ICD-10-CM | POA: Diagnosis not present

## 2015-03-06 DIAGNOSIS — Z950 Presence of cardiac pacemaker: Secondary | ICD-10-CM | POA: Diagnosis not present

## 2015-03-06 DIAGNOSIS — N189 Chronic kidney disease, unspecified: Secondary | ICD-10-CM | POA: Diagnosis not present

## 2015-03-06 DIAGNOSIS — I359 Nonrheumatic aortic valve disorder, unspecified: Secondary | ICD-10-CM | POA: Diagnosis not present

## 2015-03-06 DIAGNOSIS — I358 Other nonrheumatic aortic valve disorders: Secondary | ICD-10-CM | POA: Diagnosis not present

## 2015-03-06 DIAGNOSIS — I130 Hypertensive heart and chronic kidney disease with heart failure and stage 1 through stage 4 chronic kidney disease, or unspecified chronic kidney disease: Secondary | ICD-10-CM | POA: Diagnosis not present

## 2015-03-06 DIAGNOSIS — I421 Obstructive hypertrophic cardiomyopathy: Secondary | ICD-10-CM | POA: Diagnosis not present

## 2015-03-06 DIAGNOSIS — Z880 Allergy status to penicillin: Secondary | ICD-10-CM | POA: Diagnosis not present

## 2015-03-06 DIAGNOSIS — I25810 Atherosclerosis of coronary artery bypass graft(s) without angina pectoris: Secondary | ICD-10-CM | POA: Diagnosis not present

## 2015-03-06 DIAGNOSIS — E785 Hyperlipidemia, unspecified: Secondary | ICD-10-CM | POA: Diagnosis not present

## 2015-03-06 DIAGNOSIS — M1990 Unspecified osteoarthritis, unspecified site: Secondary | ICD-10-CM | POA: Diagnosis not present

## 2015-03-06 DIAGNOSIS — Z951 Presence of aortocoronary bypass graft: Secondary | ICD-10-CM | POA: Diagnosis not present

## 2015-03-06 DIAGNOSIS — E11649 Type 2 diabetes mellitus with hypoglycemia without coma: Secondary | ICD-10-CM | POA: Diagnosis not present

## 2015-03-06 DIAGNOSIS — J189 Pneumonia, unspecified organism: Secondary | ICD-10-CM | POA: Diagnosis not present

## 2015-03-06 DIAGNOSIS — J45909 Unspecified asthma, uncomplicated: Secondary | ICD-10-CM | POA: Diagnosis not present

## 2015-03-06 DIAGNOSIS — R0902 Hypoxemia: Secondary | ICD-10-CM | POA: Diagnosis not present

## 2015-03-06 DIAGNOSIS — J441 Chronic obstructive pulmonary disease with (acute) exacerbation: Secondary | ICD-10-CM | POA: Diagnosis not present

## 2015-03-06 DIAGNOSIS — R778 Other specified abnormalities of plasma proteins: Secondary | ICD-10-CM | POA: Diagnosis not present

## 2015-03-06 DIAGNOSIS — I252 Old myocardial infarction: Secondary | ICD-10-CM | POA: Diagnosis not present

## 2015-03-06 DIAGNOSIS — I442 Atrioventricular block, complete: Secondary | ICD-10-CM | POA: Diagnosis not present

## 2015-03-06 DIAGNOSIS — I248 Other forms of acute ischemic heart disease: Secondary | ICD-10-CM | POA: Diagnosis not present

## 2015-03-06 DIAGNOSIS — E1122 Type 2 diabetes mellitus with diabetic chronic kidney disease: Secondary | ICD-10-CM | POA: Diagnosis not present

## 2015-03-11 DIAGNOSIS — J449 Chronic obstructive pulmonary disease, unspecified: Secondary | ICD-10-CM | POA: Diagnosis not present

## 2015-03-12 DIAGNOSIS — I5032 Chronic diastolic (congestive) heart failure: Secondary | ICD-10-CM | POA: Diagnosis not present

## 2015-03-12 DIAGNOSIS — I129 Hypertensive chronic kidney disease with stage 1 through stage 4 chronic kidney disease, or unspecified chronic kidney disease: Secondary | ICD-10-CM | POA: Diagnosis not present

## 2015-03-12 DIAGNOSIS — J189 Pneumonia, unspecified organism: Secondary | ICD-10-CM | POA: Diagnosis not present

## 2015-03-12 DIAGNOSIS — Z9981 Dependence on supplemental oxygen: Secondary | ICD-10-CM | POA: Diagnosis not present

## 2015-03-12 DIAGNOSIS — L89302 Pressure ulcer of unspecified buttock, stage 2: Secondary | ICD-10-CM | POA: Diagnosis not present

## 2015-03-12 DIAGNOSIS — E119 Type 2 diabetes mellitus without complications: Secondary | ICD-10-CM | POA: Diagnosis not present

## 2015-03-12 DIAGNOSIS — J449 Chronic obstructive pulmonary disease, unspecified: Secondary | ICD-10-CM | POA: Diagnosis not present

## 2015-03-12 DIAGNOSIS — M1990 Unspecified osteoarthritis, unspecified site: Secondary | ICD-10-CM | POA: Diagnosis not present

## 2015-03-13 DIAGNOSIS — Z9981 Dependence on supplemental oxygen: Secondary | ICD-10-CM | POA: Diagnosis not present

## 2015-03-13 DIAGNOSIS — J449 Chronic obstructive pulmonary disease, unspecified: Secondary | ICD-10-CM | POA: Diagnosis not present

## 2015-03-13 DIAGNOSIS — L89302 Pressure ulcer of unspecified buttock, stage 2: Secondary | ICD-10-CM | POA: Diagnosis not present

## 2015-03-13 DIAGNOSIS — Z6820 Body mass index (BMI) 20.0-20.9, adult: Secondary | ICD-10-CM | POA: Diagnosis not present

## 2015-03-13 DIAGNOSIS — E1122 Type 2 diabetes mellitus with diabetic chronic kidney disease: Secondary | ICD-10-CM | POA: Diagnosis not present

## 2015-03-13 DIAGNOSIS — I5032 Chronic diastolic (congestive) heart failure: Secondary | ICD-10-CM | POA: Diagnosis not present

## 2015-03-13 DIAGNOSIS — M1990 Unspecified osteoarthritis, unspecified site: Secondary | ICD-10-CM | POA: Diagnosis not present

## 2015-03-13 DIAGNOSIS — I129 Hypertensive chronic kidney disease with stage 1 through stage 4 chronic kidney disease, or unspecified chronic kidney disease: Secondary | ICD-10-CM | POA: Diagnosis not present

## 2015-03-13 DIAGNOSIS — E119 Type 2 diabetes mellitus without complications: Secondary | ICD-10-CM | POA: Diagnosis not present

## 2015-03-13 DIAGNOSIS — J189 Pneumonia, unspecified organism: Secondary | ICD-10-CM | POA: Diagnosis not present

## 2015-03-14 DIAGNOSIS — J449 Chronic obstructive pulmonary disease, unspecified: Secondary | ICD-10-CM | POA: Diagnosis not present

## 2015-03-15 DIAGNOSIS — J449 Chronic obstructive pulmonary disease, unspecified: Secondary | ICD-10-CM | POA: Diagnosis not present

## 2015-03-18 DIAGNOSIS — E119 Type 2 diabetes mellitus without complications: Secondary | ICD-10-CM | POA: Diagnosis not present

## 2015-03-18 DIAGNOSIS — J189 Pneumonia, unspecified organism: Secondary | ICD-10-CM | POA: Diagnosis not present

## 2015-03-18 DIAGNOSIS — I129 Hypertensive chronic kidney disease with stage 1 through stage 4 chronic kidney disease, or unspecified chronic kidney disease: Secondary | ICD-10-CM | POA: Diagnosis not present

## 2015-03-18 DIAGNOSIS — I5032 Chronic diastolic (congestive) heart failure: Secondary | ICD-10-CM | POA: Diagnosis not present

## 2015-03-18 DIAGNOSIS — J449 Chronic obstructive pulmonary disease, unspecified: Secondary | ICD-10-CM | POA: Diagnosis not present

## 2015-03-18 DIAGNOSIS — L89302 Pressure ulcer of unspecified buttock, stage 2: Secondary | ICD-10-CM | POA: Diagnosis not present

## 2015-03-18 DIAGNOSIS — Z9981 Dependence on supplemental oxygen: Secondary | ICD-10-CM | POA: Diagnosis not present

## 2015-03-18 DIAGNOSIS — M1990 Unspecified osteoarthritis, unspecified site: Secondary | ICD-10-CM | POA: Diagnosis not present

## 2015-03-19 DIAGNOSIS — I129 Hypertensive chronic kidney disease with stage 1 through stage 4 chronic kidney disease, or unspecified chronic kidney disease: Secondary | ICD-10-CM | POA: Diagnosis not present

## 2015-03-19 DIAGNOSIS — J449 Chronic obstructive pulmonary disease, unspecified: Secondary | ICD-10-CM | POA: Diagnosis not present

## 2015-03-19 DIAGNOSIS — J189 Pneumonia, unspecified organism: Secondary | ICD-10-CM | POA: Diagnosis not present

## 2015-03-19 DIAGNOSIS — I5032 Chronic diastolic (congestive) heart failure: Secondary | ICD-10-CM | POA: Diagnosis not present

## 2015-03-19 DIAGNOSIS — M1990 Unspecified osteoarthritis, unspecified site: Secondary | ICD-10-CM | POA: Diagnosis not present

## 2015-03-19 DIAGNOSIS — Z9981 Dependence on supplemental oxygen: Secondary | ICD-10-CM | POA: Diagnosis not present

## 2015-03-19 DIAGNOSIS — E119 Type 2 diabetes mellitus without complications: Secondary | ICD-10-CM | POA: Diagnosis not present

## 2015-03-19 DIAGNOSIS — L89302 Pressure ulcer of unspecified buttock, stage 2: Secondary | ICD-10-CM | POA: Diagnosis not present

## 2015-03-20 DIAGNOSIS — J189 Pneumonia, unspecified organism: Secondary | ICD-10-CM | POA: Diagnosis not present

## 2015-03-20 DIAGNOSIS — I5032 Chronic diastolic (congestive) heart failure: Secondary | ICD-10-CM | POA: Diagnosis not present

## 2015-03-20 DIAGNOSIS — L89302 Pressure ulcer of unspecified buttock, stage 2: Secondary | ICD-10-CM | POA: Diagnosis not present

## 2015-03-20 DIAGNOSIS — I129 Hypertensive chronic kidney disease with stage 1 through stage 4 chronic kidney disease, or unspecified chronic kidney disease: Secondary | ICD-10-CM | POA: Diagnosis not present

## 2015-03-20 DIAGNOSIS — Z9981 Dependence on supplemental oxygen: Secondary | ICD-10-CM | POA: Diagnosis not present

## 2015-03-20 DIAGNOSIS — J449 Chronic obstructive pulmonary disease, unspecified: Secondary | ICD-10-CM | POA: Diagnosis not present

## 2015-03-20 DIAGNOSIS — M1990 Unspecified osteoarthritis, unspecified site: Secondary | ICD-10-CM | POA: Diagnosis not present

## 2015-03-20 DIAGNOSIS — E119 Type 2 diabetes mellitus without complications: Secondary | ICD-10-CM | POA: Diagnosis not present

## 2015-03-21 DIAGNOSIS — J189 Pneumonia, unspecified organism: Secondary | ICD-10-CM | POA: Diagnosis not present

## 2015-03-21 DIAGNOSIS — I5032 Chronic diastolic (congestive) heart failure: Secondary | ICD-10-CM | POA: Diagnosis not present

## 2015-03-21 DIAGNOSIS — L89302 Pressure ulcer of unspecified buttock, stage 2: Secondary | ICD-10-CM | POA: Diagnosis not present

## 2015-03-21 DIAGNOSIS — I129 Hypertensive chronic kidney disease with stage 1 through stage 4 chronic kidney disease, or unspecified chronic kidney disease: Secondary | ICD-10-CM | POA: Diagnosis not present

## 2015-03-21 DIAGNOSIS — M1990 Unspecified osteoarthritis, unspecified site: Secondary | ICD-10-CM | POA: Diagnosis not present

## 2015-03-21 DIAGNOSIS — E119 Type 2 diabetes mellitus without complications: Secondary | ICD-10-CM | POA: Diagnosis not present

## 2015-03-21 DIAGNOSIS — J449 Chronic obstructive pulmonary disease, unspecified: Secondary | ICD-10-CM | POA: Diagnosis not present

## 2015-03-21 DIAGNOSIS — Z9981 Dependence on supplemental oxygen: Secondary | ICD-10-CM | POA: Diagnosis not present

## 2015-03-22 DIAGNOSIS — I5032 Chronic diastolic (congestive) heart failure: Secondary | ICD-10-CM | POA: Diagnosis not present

## 2015-03-22 DIAGNOSIS — L89312 Pressure ulcer of right buttock, stage 2: Secondary | ICD-10-CM | POA: Diagnosis not present

## 2015-03-22 DIAGNOSIS — L89302 Pressure ulcer of unspecified buttock, stage 2: Secondary | ICD-10-CM | POA: Diagnosis not present

## 2015-03-22 DIAGNOSIS — Z9981 Dependence on supplemental oxygen: Secondary | ICD-10-CM | POA: Diagnosis not present

## 2015-03-22 DIAGNOSIS — J449 Chronic obstructive pulmonary disease, unspecified: Secondary | ICD-10-CM | POA: Diagnosis not present

## 2015-03-22 DIAGNOSIS — M1990 Unspecified osteoarthritis, unspecified site: Secondary | ICD-10-CM | POA: Diagnosis not present

## 2015-03-22 DIAGNOSIS — I129 Hypertensive chronic kidney disease with stage 1 through stage 4 chronic kidney disease, or unspecified chronic kidney disease: Secondary | ICD-10-CM | POA: Diagnosis not present

## 2015-03-22 DIAGNOSIS — E119 Type 2 diabetes mellitus without complications: Secondary | ICD-10-CM | POA: Diagnosis not present

## 2015-03-22 DIAGNOSIS — J189 Pneumonia, unspecified organism: Secondary | ICD-10-CM | POA: Diagnosis not present

## 2015-03-23 DIAGNOSIS — M5136 Other intervertebral disc degeneration, lumbar region: Secondary | ICD-10-CM | POA: Diagnosis not present

## 2015-03-23 DIAGNOSIS — M150 Primary generalized (osteo)arthritis: Secondary | ICD-10-CM | POA: Diagnosis not present

## 2015-03-23 DIAGNOSIS — I2510 Atherosclerotic heart disease of native coronary artery without angina pectoris: Secondary | ICD-10-CM | POA: Diagnosis not present

## 2015-03-25 DIAGNOSIS — L89302 Pressure ulcer of unspecified buttock, stage 2: Secondary | ICD-10-CM | POA: Diagnosis not present

## 2015-03-25 DIAGNOSIS — J449 Chronic obstructive pulmonary disease, unspecified: Secondary | ICD-10-CM | POA: Diagnosis not present

## 2015-03-25 DIAGNOSIS — I5032 Chronic diastolic (congestive) heart failure: Secondary | ICD-10-CM | POA: Diagnosis not present

## 2015-03-25 DIAGNOSIS — M1990 Unspecified osteoarthritis, unspecified site: Secondary | ICD-10-CM | POA: Diagnosis not present

## 2015-03-25 DIAGNOSIS — Z9981 Dependence on supplemental oxygen: Secondary | ICD-10-CM | POA: Diagnosis not present

## 2015-03-25 DIAGNOSIS — E119 Type 2 diabetes mellitus without complications: Secondary | ICD-10-CM | POA: Diagnosis not present

## 2015-03-25 DIAGNOSIS — I129 Hypertensive chronic kidney disease with stage 1 through stage 4 chronic kidney disease, or unspecified chronic kidney disease: Secondary | ICD-10-CM | POA: Diagnosis not present

## 2015-03-25 DIAGNOSIS — J189 Pneumonia, unspecified organism: Secondary | ICD-10-CM | POA: Diagnosis not present

## 2015-03-26 DIAGNOSIS — J449 Chronic obstructive pulmonary disease, unspecified: Secondary | ICD-10-CM | POA: Diagnosis not present

## 2015-03-27 DIAGNOSIS — L89302 Pressure ulcer of unspecified buttock, stage 2: Secondary | ICD-10-CM | POA: Diagnosis not present

## 2015-03-27 DIAGNOSIS — E119 Type 2 diabetes mellitus without complications: Secondary | ICD-10-CM | POA: Diagnosis not present

## 2015-03-27 DIAGNOSIS — M1990 Unspecified osteoarthritis, unspecified site: Secondary | ICD-10-CM | POA: Diagnosis not present

## 2015-03-27 DIAGNOSIS — J449 Chronic obstructive pulmonary disease, unspecified: Secondary | ICD-10-CM | POA: Diagnosis not present

## 2015-03-27 DIAGNOSIS — Z9981 Dependence on supplemental oxygen: Secondary | ICD-10-CM | POA: Diagnosis not present

## 2015-03-27 DIAGNOSIS — J189 Pneumonia, unspecified organism: Secondary | ICD-10-CM | POA: Diagnosis not present

## 2015-03-27 DIAGNOSIS — I129 Hypertensive chronic kidney disease with stage 1 through stage 4 chronic kidney disease, or unspecified chronic kidney disease: Secondary | ICD-10-CM | POA: Diagnosis not present

## 2015-03-27 DIAGNOSIS — I5032 Chronic diastolic (congestive) heart failure: Secondary | ICD-10-CM | POA: Diagnosis not present

## 2015-03-28 DIAGNOSIS — L89302 Pressure ulcer of unspecified buttock, stage 2: Secondary | ICD-10-CM | POA: Diagnosis not present

## 2015-03-28 DIAGNOSIS — I5032 Chronic diastolic (congestive) heart failure: Secondary | ICD-10-CM | POA: Diagnosis not present

## 2015-03-28 DIAGNOSIS — E119 Type 2 diabetes mellitus without complications: Secondary | ICD-10-CM | POA: Diagnosis not present

## 2015-03-28 DIAGNOSIS — J449 Chronic obstructive pulmonary disease, unspecified: Secondary | ICD-10-CM | POA: Diagnosis not present

## 2015-03-28 DIAGNOSIS — J189 Pneumonia, unspecified organism: Secondary | ICD-10-CM | POA: Diagnosis not present

## 2015-03-28 DIAGNOSIS — I129 Hypertensive chronic kidney disease with stage 1 through stage 4 chronic kidney disease, or unspecified chronic kidney disease: Secondary | ICD-10-CM | POA: Diagnosis not present

## 2015-03-28 DIAGNOSIS — Z9981 Dependence on supplemental oxygen: Secondary | ICD-10-CM | POA: Diagnosis not present

## 2015-03-28 DIAGNOSIS — M1990 Unspecified osteoarthritis, unspecified site: Secondary | ICD-10-CM | POA: Diagnosis not present

## 2015-03-29 DIAGNOSIS — J449 Chronic obstructive pulmonary disease, unspecified: Secondary | ICD-10-CM | POA: Diagnosis not present

## 2015-03-30 DIAGNOSIS — E119 Type 2 diabetes mellitus without complications: Secondary | ICD-10-CM | POA: Diagnosis not present

## 2015-03-30 DIAGNOSIS — L89302 Pressure ulcer of unspecified buttock, stage 2: Secondary | ICD-10-CM | POA: Diagnosis not present

## 2015-03-30 DIAGNOSIS — I5032 Chronic diastolic (congestive) heart failure: Secondary | ICD-10-CM | POA: Diagnosis not present

## 2015-03-30 DIAGNOSIS — I129 Hypertensive chronic kidney disease with stage 1 through stage 4 chronic kidney disease, or unspecified chronic kidney disease: Secondary | ICD-10-CM | POA: Diagnosis not present

## 2015-03-30 DIAGNOSIS — M1990 Unspecified osteoarthritis, unspecified site: Secondary | ICD-10-CM | POA: Diagnosis not present

## 2015-03-30 DIAGNOSIS — Z9981 Dependence on supplemental oxygen: Secondary | ICD-10-CM | POA: Diagnosis not present

## 2015-03-30 DIAGNOSIS — J449 Chronic obstructive pulmonary disease, unspecified: Secondary | ICD-10-CM | POA: Diagnosis not present

## 2015-03-30 DIAGNOSIS — J189 Pneumonia, unspecified organism: Secondary | ICD-10-CM | POA: Diagnosis not present

## 2015-04-01 DIAGNOSIS — E119 Type 2 diabetes mellitus without complications: Secondary | ICD-10-CM | POA: Diagnosis not present

## 2015-04-01 DIAGNOSIS — M1990 Unspecified osteoarthritis, unspecified site: Secondary | ICD-10-CM | POA: Diagnosis not present

## 2015-04-01 DIAGNOSIS — J189 Pneumonia, unspecified organism: Secondary | ICD-10-CM | POA: Diagnosis not present

## 2015-04-01 DIAGNOSIS — L89302 Pressure ulcer of unspecified buttock, stage 2: Secondary | ICD-10-CM | POA: Diagnosis not present

## 2015-04-01 DIAGNOSIS — J449 Chronic obstructive pulmonary disease, unspecified: Secondary | ICD-10-CM | POA: Diagnosis not present

## 2015-04-01 DIAGNOSIS — I129 Hypertensive chronic kidney disease with stage 1 through stage 4 chronic kidney disease, or unspecified chronic kidney disease: Secondary | ICD-10-CM | POA: Diagnosis not present

## 2015-04-01 DIAGNOSIS — I5032 Chronic diastolic (congestive) heart failure: Secondary | ICD-10-CM | POA: Diagnosis not present

## 2015-04-01 DIAGNOSIS — Z9981 Dependence on supplemental oxygen: Secondary | ICD-10-CM | POA: Diagnosis not present

## 2015-04-02 DIAGNOSIS — E119 Type 2 diabetes mellitus without complications: Secondary | ICD-10-CM | POA: Diagnosis not present

## 2015-04-02 DIAGNOSIS — L89302 Pressure ulcer of unspecified buttock, stage 2: Secondary | ICD-10-CM | POA: Diagnosis not present

## 2015-04-02 DIAGNOSIS — I129 Hypertensive chronic kidney disease with stage 1 through stage 4 chronic kidney disease, or unspecified chronic kidney disease: Secondary | ICD-10-CM | POA: Diagnosis not present

## 2015-04-02 DIAGNOSIS — J449 Chronic obstructive pulmonary disease, unspecified: Secondary | ICD-10-CM | POA: Diagnosis not present

## 2015-04-02 DIAGNOSIS — M1990 Unspecified osteoarthritis, unspecified site: Secondary | ICD-10-CM | POA: Diagnosis not present

## 2015-04-02 DIAGNOSIS — Z9981 Dependence on supplemental oxygen: Secondary | ICD-10-CM | POA: Diagnosis not present

## 2015-04-02 DIAGNOSIS — J189 Pneumonia, unspecified organism: Secondary | ICD-10-CM | POA: Diagnosis not present

## 2015-04-02 DIAGNOSIS — I5032 Chronic diastolic (congestive) heart failure: Secondary | ICD-10-CM | POA: Diagnosis not present

## 2015-04-04 DIAGNOSIS — J449 Chronic obstructive pulmonary disease, unspecified: Secondary | ICD-10-CM | POA: Diagnosis not present

## 2015-04-04 DIAGNOSIS — M1990 Unspecified osteoarthritis, unspecified site: Secondary | ICD-10-CM | POA: Diagnosis not present

## 2015-04-04 DIAGNOSIS — I129 Hypertensive chronic kidney disease with stage 1 through stage 4 chronic kidney disease, or unspecified chronic kidney disease: Secondary | ICD-10-CM | POA: Diagnosis not present

## 2015-04-04 DIAGNOSIS — I5032 Chronic diastolic (congestive) heart failure: Secondary | ICD-10-CM | POA: Diagnosis not present

## 2015-04-04 DIAGNOSIS — E119 Type 2 diabetes mellitus without complications: Secondary | ICD-10-CM | POA: Diagnosis not present

## 2015-04-04 DIAGNOSIS — Z9981 Dependence on supplemental oxygen: Secondary | ICD-10-CM | POA: Diagnosis not present

## 2015-04-04 DIAGNOSIS — J189 Pneumonia, unspecified organism: Secondary | ICD-10-CM | POA: Diagnosis not present

## 2015-04-04 DIAGNOSIS — L89302 Pressure ulcer of unspecified buttock, stage 2: Secondary | ICD-10-CM | POA: Diagnosis not present

## 2015-04-08 DIAGNOSIS — M1990 Unspecified osteoarthritis, unspecified site: Secondary | ICD-10-CM | POA: Diagnosis not present

## 2015-04-08 DIAGNOSIS — J189 Pneumonia, unspecified organism: Secondary | ICD-10-CM | POA: Diagnosis not present

## 2015-04-08 DIAGNOSIS — I5032 Chronic diastolic (congestive) heart failure: Secondary | ICD-10-CM | POA: Diagnosis not present

## 2015-04-08 DIAGNOSIS — E119 Type 2 diabetes mellitus without complications: Secondary | ICD-10-CM | POA: Diagnosis not present

## 2015-04-08 DIAGNOSIS — I129 Hypertensive chronic kidney disease with stage 1 through stage 4 chronic kidney disease, or unspecified chronic kidney disease: Secondary | ICD-10-CM | POA: Diagnosis not present

## 2015-04-08 DIAGNOSIS — J449 Chronic obstructive pulmonary disease, unspecified: Secondary | ICD-10-CM | POA: Diagnosis not present

## 2015-04-08 DIAGNOSIS — Z9981 Dependence on supplemental oxygen: Secondary | ICD-10-CM | POA: Diagnosis not present

## 2015-04-08 DIAGNOSIS — L89302 Pressure ulcer of unspecified buttock, stage 2: Secondary | ICD-10-CM | POA: Diagnosis not present

## 2015-04-10 DIAGNOSIS — J449 Chronic obstructive pulmonary disease, unspecified: Secondary | ICD-10-CM | POA: Diagnosis not present

## 2015-04-10 DIAGNOSIS — I129 Hypertensive chronic kidney disease with stage 1 through stage 4 chronic kidney disease, or unspecified chronic kidney disease: Secondary | ICD-10-CM | POA: Diagnosis not present

## 2015-04-10 DIAGNOSIS — I5032 Chronic diastolic (congestive) heart failure: Secondary | ICD-10-CM | POA: Diagnosis not present

## 2015-04-10 DIAGNOSIS — M1990 Unspecified osteoarthritis, unspecified site: Secondary | ICD-10-CM | POA: Diagnosis not present

## 2015-04-10 DIAGNOSIS — E119 Type 2 diabetes mellitus without complications: Secondary | ICD-10-CM | POA: Diagnosis not present

## 2015-04-10 DIAGNOSIS — J189 Pneumonia, unspecified organism: Secondary | ICD-10-CM | POA: Diagnosis not present

## 2015-04-10 DIAGNOSIS — Z9981 Dependence on supplemental oxygen: Secondary | ICD-10-CM | POA: Diagnosis not present

## 2015-04-10 DIAGNOSIS — L89302 Pressure ulcer of unspecified buttock, stage 2: Secondary | ICD-10-CM | POA: Diagnosis not present

## 2015-04-12 DIAGNOSIS — E1139 Type 2 diabetes mellitus with other diabetic ophthalmic complication: Secondary | ICD-10-CM | POA: Diagnosis not present

## 2015-04-22 ENCOUNTER — Emergency Department (HOSPITAL_COMMUNITY): Payer: Medicare Other

## 2015-04-22 ENCOUNTER — Emergency Department (HOSPITAL_COMMUNITY)
Admission: EM | Admit: 2015-04-22 | Discharge: 2015-04-22 | Disposition: A | Discharge status: Home / Self Care | Payer: Medicare Other | Attending: Emergency Medicine | Admitting: Emergency Medicine

## 2015-04-22 ENCOUNTER — Encounter (HOSPITAL_COMMUNITY): Payer: Self-pay | Admitting: Emergency Medicine

## 2015-04-22 DIAGNOSIS — I2510 Atherosclerotic heart disease of native coronary artery without angina pectoris: Secondary | ICD-10-CM | POA: Insufficient documentation

## 2015-04-22 DIAGNOSIS — Z880 Allergy status to penicillin: Secondary | ICD-10-CM | POA: Diagnosis not present

## 2015-04-22 DIAGNOSIS — Z7982 Long term (current) use of aspirin: Secondary | ICD-10-CM | POA: Diagnosis not present

## 2015-04-22 DIAGNOSIS — S0083XA Contusion of other part of head, initial encounter: Secondary | ICD-10-CM | POA: Diagnosis not present

## 2015-04-22 DIAGNOSIS — I10 Essential (primary) hypertension: Secondary | ICD-10-CM | POA: Diagnosis not present

## 2015-04-22 DIAGNOSIS — E119 Type 2 diabetes mellitus without complications: Secondary | ICD-10-CM | POA: Insufficient documentation

## 2015-04-22 DIAGNOSIS — W07XXXA Fall from chair, initial encounter: Secondary | ICD-10-CM | POA: Insufficient documentation

## 2015-04-22 DIAGNOSIS — I5032 Chronic diastolic (congestive) heart failure: Secondary | ICD-10-CM

## 2015-04-22 DIAGNOSIS — Z794 Long term (current) use of insulin: Secondary | ICD-10-CM | POA: Diagnosis not present

## 2015-04-22 DIAGNOSIS — J441 Chronic obstructive pulmonary disease with (acute) exacerbation: Secondary | ICD-10-CM | POA: Diagnosis not present

## 2015-04-22 DIAGNOSIS — R51 Headache: Secondary | ICD-10-CM | POA: Diagnosis not present

## 2015-04-22 DIAGNOSIS — Z87891 Personal history of nicotine dependence: Secondary | ICD-10-CM | POA: Diagnosis not present

## 2015-04-22 DIAGNOSIS — E785 Hyperlipidemia, unspecified: Secondary | ICD-10-CM | POA: Diagnosis not present

## 2015-04-22 DIAGNOSIS — Z79899 Other long term (current) drug therapy: Secondary | ICD-10-CM | POA: Diagnosis not present

## 2015-04-22 DIAGNOSIS — Y9389 Activity, other specified: Secondary | ICD-10-CM | POA: Insufficient documentation

## 2015-04-22 DIAGNOSIS — R069 Unspecified abnormalities of breathing: Secondary | ICD-10-CM | POA: Diagnosis not present

## 2015-04-22 DIAGNOSIS — Y998 Other external cause status: Secondary | ICD-10-CM | POA: Diagnosis not present

## 2015-04-22 DIAGNOSIS — Y9289 Other specified places as the place of occurrence of the external cause: Secondary | ICD-10-CM | POA: Insufficient documentation

## 2015-04-22 DIAGNOSIS — S299XXA Unspecified injury of thorax, initial encounter: Secondary | ICD-10-CM | POA: Diagnosis not present

## 2015-04-22 DIAGNOSIS — S199XXA Unspecified injury of neck, initial encounter: Secondary | ICD-10-CM | POA: Diagnosis not present

## 2015-04-22 DIAGNOSIS — S0990XA Unspecified injury of head, initial encounter: Secondary | ICD-10-CM | POA: Diagnosis not present

## 2015-04-22 LAB — BASIC METABOLIC PANEL
Anion gap: 10 (ref 5–15)
BUN: 18 mg/dL (ref 6–20)
CHLORIDE: 110 mmol/L (ref 101–111)
CO2: 24 mmol/L (ref 22–32)
CREATININE: 1.44 mg/dL — AB (ref 0.44–1.00)
Calcium: 8.8 mg/dL — ABNORMAL LOW (ref 8.9–10.3)
GFR calc non Af Amer: 31 mL/min — ABNORMAL LOW (ref >60)
GFR, EST AFRICAN AMERICAN: 36 mL/min — AB (ref >60)
Glucose, Bld: 142 mg/dL — ABNORMAL HIGH (ref 65–99)
POTASSIUM: 4.4 mmol/L (ref 3.5–5.1)
SODIUM: 144 mmol/L (ref 135–145)

## 2015-04-22 LAB — CBC WITH DIFFERENTIAL/PLATELET
Basophils Absolute: 0 10*3/uL (ref 0.0–0.1)
Basophils Relative: 0 %
EOS ABS: 0.1 10*3/uL (ref 0.0–0.7)
Eosinophils Relative: 1 %
HCT: 34.4 % — ABNORMAL LOW (ref 36.0–46.0)
HEMOGLOBIN: 10.5 g/dL — AB (ref 12.0–15.0)
LYMPHS ABS: 0.8 10*3/uL (ref 0.7–4.0)
Lymphocytes Relative: 11 %
MCH: 25.6 pg — AB (ref 26.0–34.0)
MCHC: 30.5 g/dL (ref 30.0–36.0)
MCV: 83.9 fL (ref 78.0–100.0)
Monocytes Absolute: 0.4 10*3/uL (ref 0.1–1.0)
Monocytes Relative: 5 %
NEUTROS PCT: 83 %
Neutro Abs: 6.2 10*3/uL (ref 1.7–7.7)
Platelets: 197 10*3/uL (ref 150–400)
RBC: 4.1 MIL/uL (ref 3.87–5.11)
RDW: 15.2 % (ref 11.5–15.5)
WBC: 7.4 10*3/uL (ref 4.0–10.5)

## 2015-04-22 LAB — BRAIN NATRIURETIC PEPTIDE: B NATRIURETIC PEPTIDE 5: 1260.4 pg/mL — AB (ref 0.0–100.0)

## 2015-04-22 MED ORDER — FUROSEMIDE 20 MG PO TABS: 20.0000 mg | ORAL_TABLET | Freq: Two times a day (BID) | ORAL | Status: AC

## 2015-04-22 NOTE — ED Provider Notes (Signed)
CSN: [id number]     Arrival date & time 04/22/15  0537 History   First MD Initiated Contact with Patient 04/22/15 0557     Chief Complaint  Patient presents with  . Fall     (Consider location/radiation/quality/duration/timing/severity/associated sxs/prior Treatment) Patient is a 82 y.o. female presenting with fall. The history is provided by the patient.  Fall   Pt with hx COPD, DM, CAD s/p CABG presents after fall from chair this morning just PTA.  States she got up to get a drink, sat down at her kitchen table, fell asleep, and toppled forward onto the tile floor.  She notes some soreness and swelling to her forehead, denies any other injury, pain, or concern at this time.  [place] in [place], was d/c home on home O2.  States she has had no worsening of her breathing since then, no new fevers or cough.  Has noted increased swelling in her legs since they took her off her home lasix.  She is not on blood thinners.    Past Medical History  Diagnosis Date  . Coronary atherosclerosis of autologous vein bypass graft   . 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 Unspecified essential hypertension   . Dyslipidemia   . Type II or unspecified type diabetes mellitus without mention of complication, not stated as uncontrolled   . DJD (degenerative joint disease)   . COPD (chronic obstructive pulmonary disease) [facility])    Past Surgical History  Procedure Laterality Date  . Coronary artery bypass graft      x4 in 1998. She had LIMA to LAD, saphenous vein graft to PDA, saphenous vein graft to diagonal #1, and saphenous vein graft to OM-1.    Family History  Problem Relation Age of Onset  . Cancer Mother   . Heart Problems Brother   . Cancer Brother   . Heart attack Sister   . Heart attack Daughter   . Heart Problems Brother    Social History  Substance Use Topics  . Smoking status: Former Games developermoker  .  Smokeless tobacco: None     Comment: quit in 1998. she had smoked 1.5 ppd for 30 years   . Alcohol Use: No   OB History    No data available     Review of Systems  All other systems reviewed and are negative.     Allergies  Ace inhibitors and Penicillins  Home Medications   Prior to Admission medications   Medication Sig Start Date End Date Taking? Authorizing Provider  albuterol (PROVENTIL) (2.5 MG/3ML) 0.083% nebulizer solution USE 1 VIAL IN NEBULZIER EVERY 4 TO 6 HOURS AS NEEDED FOR SHORTNESS OF BREATH OR WHEEZING 08/15/14   Historical Provider, MD  allopurinol (ZYLOPRIM) 100 MG tablet Take 100 mg by mouth daily.  10/03/12   Historical Provider, MD  amLODipine (NORVASC) 10 MG tablet Take 10 mg by mouth daily.      Historical Provider, MD  aspirin 81 MG tablet Take 81 mg by mouth daily.      Historical Provider, MD  calcitRIOL (ROCALTROL) 0.25 MCG capsule Take 0.25 mcg by mouth daily.  09/28/12   Historical Provider, MD  ferrous sulfate 325 (65 FE) MG tablet Take 325 mg by mouth 2 (two) times daily.      Historical Provider, MD  furosemide (LASIX) 20 MG tablet Take 2 tablets (40 mg total) by mouth daily. 07/28/11  Marinus Maw, MD  insulin aspart (NOVOLOG) 100 UNIT/ML injection Inject 5 units into the skin in the am 5 units in the pm    Historical Provider, MD  metoprolol tartrate (LOPRESSOR) 25 MG tablet TAKE 1 TABLET BY MOUTH TWICE A DAY 10/17/11   Tonny Bollman, MD  nitroGLYCERIN (NITROSTAT) 0.4 MG SL tablet Place 0.4 mg under the tongue every 5 (five) minutes x 3 doses as needed for chest pain.     Historical Provider, MD  NOVOLIN 70/30 (70-30) 100 UNIT/ML injection Inject 10 Units into the skin 2 (two) times daily with a meal.  06/09/13   Historical Provider, MD   BP 135/63 mmHg  Pulse 103  Temp(Src) 97.9 F (36.6 C) (Axillary)  Ht 5' (1.524 m)  Wt 45.36 kg  BMI 19.53 kg/m2  SpO2 100%  LMP  (LMP Unknown) Physical Exam  Constitutional: She appears well-developed and  well-nourished. No distress.  HENT:  Head: Normocephalic.    Neck: Normal range of motion. Neck supple.  Cardiovascular: Normal rate and regular rhythm.   Pulmonary/Chest: Effort normal. Tachypnea noted. No respiratory distress. She has wheezes. She has no rhonchi. She has no rales.  Abdominal: Soft. She exhibits no distension. There is no tenderness. There is no rebound and no guarding.  Musculoskeletal: She exhibits edema (bilateral lower extremity edema to the level of the knee, symmetric ). She exhibits no tenderness.  Neurological: She is alert. She exhibits normal muscle tone.  Skin: She is not diaphoretic.  Nursing note and vitals reviewed.   ED Course  Procedures (including critical care time) Labs Review Labs Reviewed  BASIC METABOLIC PANEL - Abnormal; Notable for the following:    Glucose, Bld 142 (*)    Creatinine, Ser 1.44 (*)    Calcium 8.8 (*)    GFR calc non Af Amer 31 (*)    GFR calc Af Amer 36 (*)    All other components within normal limits  CBC WITH DIFFERENTIAL/PLATELET - Abnormal; Notable for the following:    Hemoglobin 10.5 (*)    HCT 34.4 (*)    MCH 25.6 (*)    All other components within normal limits  BRAIN NATRIURETIC PEPTIDE - Abnormal; Notable for the following:    B Natriuretic Peptide 1260.4 (*)    All other components within normal limits    Imaging Review Dg Chest 2 View  04/22/2015  CLINICAL DATA:  Larey Seat this morning, forehead hematoma. Shortness of breath. History of COPD. EXAM: CHEST  2 VIEW COMPARISON:  Chest radiograph March 06, 2015 FINDINGS: The cardiac silhouette is moderately enlarged, unchanged. Status post median sternotomy for CABG. Calcified aortic knob. Similar chronic interstitial changes and increased lung volumes compatible with COPD. Improved aeration RIGHT midlung zone. No pleural effusion. No pneumothorax. Dual lead LEFT cardiac pacemaker in situ. Osteopenia. Soft tissue planes are nonsuspicious. IMPRESSION: Stable  cardiomegaly and COPD.  Improved aeration RIGHT midlung zone. Electronically Signed   By: Awilda Metro M.D.   On: 04/22/2015 06:31   Ct Head Wo Contrast  04/22/2015  CLINICAL DATA:  Patient fell this morning with large hematoma to right forehead, no loss of consciousness EXAM: CT HEAD WITHOUT CONTRAST CT CERVICAL SPINE WITHOUT CONTRAST TECHNIQUE: Multidetector CT imaging of the head and cervical spine was performed following the standard protocol without intravenous contrast. Multiplanar CT image reconstructions of the cervical spine were also generated. COMPARISON:  02/16/2013 FINDINGS: CT HEAD FINDINGS Consistent with clinical history there is a large right scalp hematoma anteriorly on  the right near the vertex. No intracranial hemorrhage or extra-axial fluid. No infarct mass or hydrocephalus. Diffuse atrophy and low attenuation in the deep white matter stable. Calvarium intact with no evidence of skull fracture. CT CERVICAL SPINE FINDINGS Tortuous and heavily calcified carotid arteries. No acute soft tissue abnormalities. There is distention of the left jugular vein. There are numerous thyroid nodules largest measuring about 12 mm. Emphysematous changes seen in the lung apices. Normal alignment with no prevertebral soft tissue swelling. Degenerative disc disease of moderate severity at C4-5 and C5-6 with mild C6-7 and C7-T1 degenerative disc disease. There is approximately 33% compression deformity of C7 with no significant retropulsion. No acute fracture line identified. IMPRESSION: 1. Scalp hematoma with no acute intracranial abnormality 2. No evidence of acute cervical spine fracture. Mild C7 compression deformity shows an a chronic appearance although no prior images of this area are available for comparison. Electronically Signed   By: Esperanza Heir M.D.   On: 04/22/2015 06:58   Ct Cervical Spine Wo Contrast  04/22/2015  CLINICAL DATA:  Patient fell this morning with large hematoma to right  forehead, no loss of consciousness EXAM: CT HEAD WITHOUT CONTRAST CT CERVICAL SPINE WITHOUT CONTRAST TECHNIQUE: Multidetector CT imaging of the head and cervical spine was performed following the standard protocol without intravenous contrast. Multiplanar CT image reconstructions of the cervical spine were also generated. COMPARISON:  02/16/2013 FINDINGS: CT HEAD FINDINGS Consistent with clinical history there is a large right scalp hematoma anteriorly on the right near the vertex. No intracranial hemorrhage or extra-axial fluid. No infarct mass or hydrocephalus. Diffuse atrophy and low attenuation in the deep white matter stable. Calvarium intact with no evidence of skull fracture. CT CERVICAL SPINE FINDINGS Tortuous and heavily calcified carotid arteries. No acute soft tissue abnormalities. There is distention of the left jugular vein. There are numerous thyroid nodules largest measuring about 12 mm. Emphysematous changes seen in the lung apices. Normal alignment with no prevertebral soft tissue swelling. Degenerative disc disease of moderate severity at C4-5 and C5-6 with mild C6-7 and C7-T1 degenerative disc disease. There is approximately 33% compression deformity of C7 with no significant retropulsion. No acute fracture line identified. IMPRESSION: 1. Scalp hematoma with no acute intracranial abnormality 2. No evidence of acute cervical spine fracture. Mild C7 compression deformity shows an a chronic appearance although no prior images of this area are available for comparison. Electronically Signed   By: Esperanza Heir M.D.   On: 04/22/2015 06:58   I have personally reviewed and evaluated these images and lab results as part of my medical decision-making.   EKG Interpretation   Date/Time:  Monday April 22 2015 06:07:16 EDT Ventricular Rate:  104 PR Interval:  175 QRS Duration: 159 QT Interval:  390 QTC Calculation: 513 R Axis:   -104 Text Interpretation:  Ventricular-paced rhythm No further  analysis  attempted due to paced rhythm Confirmed by HORTON  MD, Toni Amend ([id number]) on  04/22/2015 7:02:37 AM      MDM   Final diagnoses:  Fall from chair, initial encounter  Traumatic hematoma of forehead, initial encounter   Afebrile nontoxic patient brought in by EMS after falling from chair that she fell asleep in.  Right forehead hematoma.  Pt denies other injury.  Pt also tachypneic and wheezing with bilateral lower extremity edema.  CT head, c-spine negative for acute injury.  Labs at baseline.  CXR shows improvement.   EKG paced. Discharge summary from recent hospitalization reviewed on Care Everywhere. Pt  treated for CAP, there is a note that pt has chronic CHF and required some IV lasix, was to go home on  lasix BID.  Her Pro-BNP was 9590.  Pt also seen by Dr Wilkie Aye.   Discussed findings and plan with patient and patient's daughter who are comfortable with discharge.  Per patient and daughter, they do not have lasix at home and weren't sure if they were supposed to take it or not.  I have discussed with them what I read on the discharge information, coupled with the increased peripheral edema, advised to start it today but also call PCP today to discuss.  Patient has family members who live with her at home.  She has oxygen and breathing treatments.  She ambulates with mobilized wheelchair.  Per daughter, family has been trying to convince patient to stop sleeping in chairs and stay in bed but this is something she has done for a long time.  Denies new orthopnea or any worsening of her baseline condition.  Daughter agrees with this and both agree with discharge.  Discussed result, findings, treatment, and follow up  with patient.  Pt given return precautions.  Pt verbalizes understanding and agrees with plan.       Trixie Dredge, PA-C 04/22/15 1191  Shon Baton, MD 04/23/15 [phone]

## 2015-04-22 NOTE — ED Notes (Signed)
[facility] with c/o fall this morning (immediately PTA). Pt with large hematoma to right forehead. Reports no LOC. States she was "sitting in my chair and fell forward hitting my head". Pt also c/o some shortness of breath...h/o COPD.

## 2015-04-22 NOTE — ED Notes (Signed)
Pt moved from Hallway 8 bed to Room 33 - Pod D via stretcher.

## 2015-04-22 NOTE — Discharge Instructions (Signed)
Read the information below.  Use the prescribed medication as directed.  Please discuss all new medications with your pharmacist.  You may return to the Emergency Department at any time for worsening condition or any new symptoms that concern you.    If you develop chest pain, worsening shortness of breath, fever, you pass out, or become weak or dizzy, return to the ER for a recheck.     You have had a head injury which does not appear to require admission at this time. A concussion is a state of changed mental ability from trauma. SEEK IMMEDIATE MEDICAL ATTENTION IF: There is confusion or drowsiness (although children frequently become drowsy after injury).  You cannot awaken the injured person.  There is nausea (feeling sick to your stomach) or continued, forceful vomiting.  You notice dizziness or unsteadiness which is getting worse, or inability to walk.  You have convulsions or unconsciousness.  You experience severe, persistent headaches not relieved by Tylenol?. (Do not take aspirin as this impairs clotting abilities). Take other pain medications only as directed.  You cannot use arms or legs normally.  There are changes in pupil sizes. (This is the black center in the colored part of the eye)  There is clear or bloody discharge from the nose or ears.  Change in speech, vision, swallowing, or understanding.  Localized weakness, numbness, tingling, or change in bowel or bladder control.   Facial or Scalp Contusion  A facial or scalp contusion is a deep bruise on the face or head. Contusions happen when an injury causes bleeding under the skin. Signs of bruising include pain, puffiness (swelling), and discolored skin. The contusion may turn blue, purple, or yellow. HOME CARE  Only take medicines as told by your doctor.  Put ice on the injured area.  Put ice in a plastic bag.  Place a towel between your skin and the bag.  Leave the ice on for 20 minutes, 2-3 times a day. GET HELP  IF:  You have bite problems.  You have pain when chewing.  You are worried about your face not healing normally. GET HELP RIGHT AWAY IF:   You have severe pain or a headache and medicine does not help.  You are very tired or confused, or your personality changes.  You throw up (vomit).  You have a nosebleed that will not stop.  You see two of everything (double vision) or have blurry vision.  You have fluid coming from your nose or ear.  You have problems walking or using your arms or legs. MAKE SURE YOU:   Understand these instructions.  Will watch your condition.  Will get help right away if you are not doing well or get worse.   This information is not intended to replace advice given to you by your health care provider. Make sure you discuss any questions you have with your health care provider.   Document Released: 12/25/2010 Document Revised: 01/26/2014 Document Reviewed: 08/18/2012 Elsevier Interactive Patient Education 2016 [employer]  Head Injury, Adult You have a head injury. Headaches and throwing up (vomiting) are common after a head injury. It should be easy to wake up from sleeping. Sometimes you must stay in the hospital. Most problems happen within the first 24 hours. Side effects may occur up to 7-10 days after the injury.  WHAT ARE THE TYPES OF HEAD INJURIES? Head injuries can be as minor as a bump. Some head injuries can be more severe. More severe head injuries  include:  A jarring injury to the brain (concussion).  A bruise of the brain (contusion). This mean there is bleeding in the brain that can cause swelling.  A cracked skull (skull fracture).  Bleeding in the brain that collects, clots, and forms a bump (hematoma). WHEN SHOULD I GET HELP RIGHT AWAY?   You are confused or sleepy.  You cannot be woken up.  You feel sick to your stomach (nauseous) or keep throwing up (vomiting).  Your dizziness or unsteadiness is getting worse.  You  have very bad, lasting headaches that are not helped by medicine. Take medicines only as told by your doctor.  You cannot use your arms or legs like normal.  You cannot walk.  You notice changes in the black spots in the center of the colored part of your eye (pupil).  You have clear or bloody fluid coming from your nose or ears.  You have trouble seeing. During the next 24 hours after the injury, you must stay with someone who can watch you. This person should get help right away (call 911 in the [place] if you start to shake and are not able to control it (have seizures), you pass out, or you are unable to wake up. HOW CAN I PREVENT A HEAD INJURY IN THE FUTURE?  Wear seat belts.  Wear a helmet while bike riding and playing sports like football.  Stay away from dangerous activities around the house. WHEN CAN I RETURN TO NORMAL ACTIVITIES AND ATHLETICS? See your doctor before doing these activities. You should not do normal activities or play contact sports until 1 week after the following symptoms have stopped:  Headache that does not go away.  Dizziness.  Poor attention.  Confusion.  Memory problems.  Sickness to your stomach or throwing up.  Tiredness.  Fussiness.  Bothered by bright lights or loud noises.  Anxiousness or depression.  Restless sleep. MAKE SURE YOU:   Understand these instructions.  Will watch your condition.  Will get help right away if you are not doing well or get worse.   This information is not intended to replace advice given to you by your health care provider. Make sure you discuss any questions you have with your health care provider.   Document Released: 12/19/2007 Document Revised: 01/26/2014 Document Reviewed: 09/12/2012 Elsevier Interactive Patient Education [employer] Inc.

## 2015-04-23 DIAGNOSIS — M150 Primary generalized (osteo)arthritis: Secondary | ICD-10-CM | POA: Diagnosis not present

## 2015-04-23 DIAGNOSIS — I2510 Atherosclerotic heart disease of native coronary artery without angina pectoris: Secondary | ICD-10-CM | POA: Diagnosis not present

## 2015-04-23 DIAGNOSIS — M5136 Other intervertebral disc degeneration, lumbar region: Secondary | ICD-10-CM | POA: Diagnosis not present

## 2015-04-25 ENCOUNTER — Encounter: Payer: Self-pay | Admitting: Internal Medicine

## 2015-04-25 ENCOUNTER — Ambulatory Visit (INDEPENDENT_AMBULATORY_CARE_PROVIDER_SITE_OTHER): Payer: Medicare Other | Admitting: *Deleted

## 2015-04-25 DIAGNOSIS — Z950 Presence of cardiac pacemaker: Secondary | ICD-10-CM | POA: Diagnosis not present

## 2015-04-25 LAB — CUP PACEART INCLINIC DEVICE CHECK
Battery Impedance: 797 Ohm
Battery Remaining Longevity: 58 mo
Battery Voltage: 2.79 V
Brady Statistic AP VP Percent: 1 %
Implantable Lead Implant Date: 20110615
Implantable Lead Implant Date: 20110615
Implantable Lead Model: 5076
Lead Channel Impedance Value: 424 Ohm
Lead Channel Pacing Threshold Amplitude: 1 V
Lead Channel Sensing Intrinsic Amplitude: 1.4 mV
Lead Channel Sensing Intrinsic Amplitude: 5.6 mV
Lead Channel Setting Pacing Pulse Width: 0.4 ms
Lead Channel Setting Sensing Sensitivity: 2.8 mV
MDC IDC LEAD LOCATION: 753859
MDC IDC LEAD LOCATION: 753860
MDC IDC MSMT LEADCHNL RA PACING THRESHOLD PULSEWIDTH: 0.4 ms
MDC IDC MSMT LEADCHNL RV IMPEDANCE VALUE: 443 Ohm
MDC IDC MSMT LEADCHNL RV PACING THRESHOLD AMPLITUDE: 0.5 V
MDC IDC MSMT LEADCHNL RV PACING THRESHOLD PULSEWIDTH: 0.4 ms
MDC IDC SESS DTM: 20170406130322
MDC IDC SET LEADCHNL RA PACING AMPLITUDE: 2 V
MDC IDC SET LEADCHNL RV PACING AMPLITUDE: 2.5 V
MDC IDC STAT BRADY AP VS PERCENT: 0 %
MDC IDC STAT BRADY AS VP PERCENT: 99 %
MDC IDC STAT BRADY AS VS PERCENT: 0 %

## 2015-04-25 NOTE — Progress Notes (Signed)
Pacemaker check in clinic. Normal device function. Thresholds, sensing, impedances consistent with previous measurements. Device programmed to maximize longevity.1 mode switch <30sec no EGM.  No high ventricular rates noted. Device programmed at appropriate safety margins. Histogram distribution appropriate for patient activity level. Device programmed to optimize intrinsic conduction. Estimated longevity 5 years. ROV with GT in 6months. Patient education completed.

## 2015-05-06 ENCOUNTER — Inpatient Hospital Stay (HOSPITAL_COMMUNITY)
Admission: EM | Admit: 2015-05-06 | Discharge: 2015-05-20 | DRG: 291 | Disposition: E | Payer: Medicare Other | Attending: Pulmonary Disease | Admitting: Pulmonary Disease

## 2015-05-06 ENCOUNTER — Emergency Department (HOSPITAL_COMMUNITY): Payer: Medicare Other

## 2015-05-06 ENCOUNTER — Encounter (HOSPITAL_COMMUNITY): Payer: Self-pay | Admitting: *Deleted

## 2015-05-06 DIAGNOSIS — E870 Hyperosmolality and hypernatremia: Secondary | ICD-10-CM | POA: Diagnosis not present

## 2015-05-06 DIAGNOSIS — I5033 Acute on chronic diastolic (congestive) heart failure: Secondary | ICD-10-CM | POA: Diagnosis not present

## 2015-05-06 DIAGNOSIS — Z66 Do not resuscitate: Secondary | ICD-10-CM | POA: Diagnosis not present

## 2015-05-06 DIAGNOSIS — Z87891 Personal history of nicotine dependence: Secondary | ICD-10-CM

## 2015-05-06 DIAGNOSIS — Z515 Encounter for palliative care: Secondary | ICD-10-CM | POA: Diagnosis not present

## 2015-05-06 DIAGNOSIS — J9600 Acute respiratory failure, unspecified whether with hypoxia or hypercapnia: Secondary | ICD-10-CM

## 2015-05-06 DIAGNOSIS — I959 Hypotension, unspecified: Secondary | ICD-10-CM | POA: Diagnosis present

## 2015-05-06 DIAGNOSIS — E872 Acidosis: Secondary | ICD-10-CM | POA: Diagnosis present

## 2015-05-06 DIAGNOSIS — I110 Hypertensive heart disease with heart failure: Secondary | ICD-10-CM | POA: Diagnosis not present

## 2015-05-06 DIAGNOSIS — M1990 Unspecified osteoarthritis, unspecified site: Secondary | ICD-10-CM | POA: Diagnosis present

## 2015-05-06 DIAGNOSIS — E785 Hyperlipidemia, unspecified: Secondary | ICD-10-CM | POA: Diagnosis not present

## 2015-05-06 DIAGNOSIS — R451 Restlessness and agitation: Secondary | ICD-10-CM | POA: Diagnosis not present

## 2015-05-06 DIAGNOSIS — R0602 Shortness of breath: Secondary | ICD-10-CM | POA: Diagnosis not present

## 2015-05-06 DIAGNOSIS — E46 Unspecified protein-calorie malnutrition: Secondary | ICD-10-CM | POA: Diagnosis present

## 2015-05-06 DIAGNOSIS — Z950 Presence of cardiac pacemaker: Secondary | ICD-10-CM | POA: Diagnosis not present

## 2015-05-06 DIAGNOSIS — J9602 Acute respiratory failure with hypercapnia: Secondary | ICD-10-CM | POA: Diagnosis present

## 2015-05-06 DIAGNOSIS — Z79899 Other long term (current) drug therapy: Secondary | ICD-10-CM

## 2015-05-06 DIAGNOSIS — R0600 Dyspnea, unspecified: Secondary | ICD-10-CM | POA: Diagnosis not present

## 2015-05-06 DIAGNOSIS — Z9981 Dependence on supplemental oxygen: Secondary | ICD-10-CM

## 2015-05-06 DIAGNOSIS — J441 Chronic obstructive pulmonary disease with (acute) exacerbation: Secondary | ICD-10-CM

## 2015-05-06 DIAGNOSIS — Z8249 Family history of ischemic heart disease and other diseases of the circulatory system: Secondary | ICD-10-CM

## 2015-05-06 DIAGNOSIS — I10 Essential (primary) hypertension: Secondary | ICD-10-CM | POA: Diagnosis present

## 2015-05-06 DIAGNOSIS — E878 Other disorders of electrolyte and fluid balance, not elsewhere classified: Secondary | ICD-10-CM | POA: Diagnosis not present

## 2015-05-06 DIAGNOSIS — Z7982 Long term (current) use of aspirin: Secondary | ICD-10-CM

## 2015-05-06 DIAGNOSIS — M109 Gout, unspecified: Secondary | ICD-10-CM | POA: Diagnosis present

## 2015-05-06 DIAGNOSIS — Z681 Body mass index (BMI) 19 or less, adult: Secondary | ICD-10-CM | POA: Diagnosis not present

## 2015-05-06 DIAGNOSIS — Z794 Long term (current) use of insulin: Secondary | ICD-10-CM | POA: Diagnosis not present

## 2015-05-06 DIAGNOSIS — D649 Anemia, unspecified: Secondary | ICD-10-CM | POA: Diagnosis present

## 2015-05-06 DIAGNOSIS — E119 Type 2 diabetes mellitus without complications: Secondary | ICD-10-CM | POA: Diagnosis present

## 2015-05-06 DIAGNOSIS — R0603 Acute respiratory distress: Secondary | ICD-10-CM

## 2015-05-06 DIAGNOSIS — I25810 Atherosclerosis of coronary artery bypass graft(s) without angina pectoris: Secondary | ICD-10-CM | POA: Diagnosis not present

## 2015-05-06 LAB — CBG MONITORING, ED: Glucose-Capillary: 179 mg/dL — ABNORMAL HIGH (ref 65–99)

## 2015-05-06 LAB — POCT I-STAT 3, ART BLOOD GAS (G3+)
Acid-base deficit: 6 mmol/L — ABNORMAL HIGH (ref 0.0–2.0)
Bicarbonate: 21 mEq/L (ref 20.0–24.0)
O2 Saturation: 92 %
PCO2 ART: 45.6 mmHg — AB (ref 35.0–45.0)
PO2 ART: 70 mmHg — AB (ref 80.0–100.0)
Patient temperature: 97.6
TCO2: 22 mmol/L (ref 0–100)
pH, Arterial: 7.268 — ABNORMAL LOW (ref 7.350–7.450)

## 2015-05-06 LAB — I-STAT ARTERIAL BLOOD GAS, ED
ACID-BASE DEFICIT: 8 mmol/L — AB (ref 0.0–2.0)
Acid-base deficit: 3 mmol/L — ABNORMAL HIGH (ref 0.0–2.0)
Acid-base deficit: 7 mmol/L — ABNORMAL HIGH (ref 0.0–2.0)
BICARBONATE: 25.9 meq/L — AB (ref 20.0–24.0)
BICARBONATE: 22.2 meq/L (ref 20.0–24.0)
BICARBONATE: 19.8 meq/L — AB (ref 20.0–24.0)
O2 SAT: 99 %
O2 SAT: 96 %
O2 Saturation: 86 %
PCO2 ART: 64.8 mmHg — AB (ref 35.0–45.0)
PCO2 ART: 63.6 mmHg — AB (ref 35.0–45.0)
PH ART: 7.197 — AB (ref 7.350–7.450)
PO2 ART: 175 mmHg — AB (ref 80.0–100.0)
PO2 ART: 97 mmHg (ref 80.0–100.0)
Patient temperature: 98.6
Patient temperature: 98.6
Patient temperature: 98.6
TCO2: 28 mmol/L (ref 0–100)
TCO2: 24 mmol/L (ref 0–100)
TCO2: 21 mmol/L (ref 0–100)
pCO2 arterial: 50.9 mmHg — ABNORMAL HIGH (ref 35.0–45.0)
pH, Arterial: 7.21 — ABNORMAL LOW (ref 7.350–7.450)
pH, Arterial: 7.152 — CL (ref 7.350–7.450)
pO2, Arterial: 67 mmHg — ABNORMAL LOW (ref 80.0–100.0)

## 2015-05-06 LAB — BASIC METABOLIC PANEL WITH GFR
Anion gap: 10 (ref 5–15)
BUN: 19 mg/dL (ref 6–20)
CO2: 24 mmol/L (ref 22–32)
Calcium: 8.8 mg/dL — ABNORMAL LOW (ref 8.9–10.3)
Chloride: 110 mmol/L (ref 101–111)
Creatinine, Ser: 1.45 mg/dL — ABNORMAL HIGH (ref 0.44–1.00)
GFR calc Af Amer: 35 mL/min — ABNORMAL LOW
GFR calc non Af Amer: 30 mL/min — ABNORMAL LOW
Glucose, Bld: 159 mg/dL — ABNORMAL HIGH (ref 65–99)
Potassium: 4.1 mmol/L (ref 3.5–5.1)
Sodium: 144 mmol/L (ref 135–145)

## 2015-05-06 LAB — CBC WITH DIFFERENTIAL/PLATELET
Basophils Absolute: 0 K/uL (ref 0.0–0.1)
Basophils Relative: 0 %
Eosinophils Absolute: 0 K/uL (ref 0.0–0.7)
Eosinophils Relative: 0 %
HCT: 34.8 % — ABNORMAL LOW (ref 36.0–46.0)
Hemoglobin: 10.2 g/dL — ABNORMAL LOW (ref 12.0–15.0)
Lymphocytes Relative: 10 %
Lymphs Abs: 1.3 K/uL (ref 0.7–4.0)
MCH: 24.3 pg — ABNORMAL LOW (ref 26.0–34.0)
MCHC: 29.3 g/dL — ABNORMAL LOW (ref 30.0–36.0)
MCV: 83.1 fL (ref 78.0–100.0)
Monocytes Absolute: 0.7 K/uL (ref 0.1–1.0)
Monocytes Relative: 6 %
Neutro Abs: 10.3 K/uL — ABNORMAL HIGH (ref 1.7–7.7)
Neutrophils Relative %: 84 %
Platelets: 225 K/uL (ref 150–400)
RBC: 4.19 MIL/uL (ref 3.87–5.11)
RDW: 15.1 % (ref 11.5–15.5)
WBC: 12.4 K/uL — ABNORMAL HIGH (ref 4.0–10.5)

## 2015-05-06 LAB — I-STAT CG4 LACTIC ACID, ED: Lactic Acid, Venous: 0.97 mmol/L (ref 0.5–2.0)

## 2015-05-06 LAB — TROPONIN I
Troponin I: 0.03 ng/mL (ref <0.031)
Troponin I: 0.06 ng/mL — ABNORMAL HIGH (ref <0.031)

## 2015-05-06 LAB — MRSA PCR SCREENING: MRSA by PCR: NEGATIVE

## 2015-05-06 LAB — I-STAT TROPONIN, ED
Troponin i, poc: 0.02 ng/mL (ref 0.00–0.08)
Troponin i, poc: 0.03 ng/mL (ref 0.00–0.08)

## 2015-05-06 LAB — BRAIN NATRIURETIC PEPTIDE: B Natriuretic Peptide: 1639.7 pg/mL — ABNORMAL HIGH (ref 0.0–100.0)

## 2015-05-06 LAB — GLUCOSE, CAPILLARY: Glucose-Capillary: 176 mg/dL — ABNORMAL HIGH (ref 65–99)

## 2015-05-06 MED ORDER — SODIUM CHLORIDE 0.9 % IV BOLUS (SEPSIS)
250.0000 mL | Freq: Once | INTRAVENOUS | Status: AC
  Administered 2015-05-06: 250 mL via INTRAVENOUS

## 2015-05-06 MED ORDER — SODIUM CHLORIDE 0.9 % IV BOLUS (SEPSIS)
1000.0000 mL | Freq: Once | INTRAVENOUS | Status: AC
Start: 2015-05-06 — End: 2015-05-06
  Administered 2015-05-06: 1000 mL via INTRAVENOUS

## 2015-05-06 MED ORDER — METHYLPREDNISOLONE SODIUM SUCC 40 MG IJ SOLR
40.0000 mg | Freq: Two times a day (BID) | INTRAMUSCULAR | Status: DC
  Administered 2015-05-06 – 2015-05-07 (×2): 40 mg via INTRAVENOUS
  Filled 2015-05-06 (×3): qty 1

## 2015-05-06 MED ORDER — ALBUTEROL SULFATE (2.5 MG/3ML) 0.083% IN NEBU
2.5000 mg | INHALATION_SOLUTION | RESPIRATORY_TRACT | Status: DC | PRN
  Administered 2015-05-07: 2.5 mg via RESPIRATORY_TRACT
  Filled 2015-05-06: qty 3

## 2015-05-06 MED ORDER — CETYLPYRIDINIUM CHLORIDE 0.05 % MT LIQD: 7.0000 mL | Freq: Two times a day (BID) | OROMUCOSAL | Status: DC

## 2015-05-06 MED ORDER — ASPIRIN 81 MG PO CHEW: 324.0000 mg | CHEWABLE_TABLET | ORAL | Status: AC

## 2015-05-06 MED ORDER — MAGNESIUM SULFATE 2 GM/50ML IV SOLN
2.0000 g | Freq: Once | INTRAVENOUS | Status: AC
  Administered 2015-05-06: 2 g via INTRAVENOUS
  Filled 2015-05-06: qty 50

## 2015-05-06 MED ORDER — HEPARIN SODIUM (PORCINE) 5000 UNIT/ML IJ SOLN
5000.0000 [IU] | Freq: Three times a day (TID) | INTRAMUSCULAR | Status: DC
  Administered 2015-05-06 – 2015-05-08 (×6): 5000 [IU] via SUBCUTANEOUS
  Filled 2015-05-06 (×8): qty 1

## 2015-05-06 MED ORDER — ASPIRIN 300 MG RE SUPP
300.0000 mg | Freq: Once | RECTAL | Status: DC
  Filled 2015-05-06: qty 1

## 2015-05-06 MED ORDER — IPRATROPIUM-ALBUTEROL 0.5-2.5 (3) MG/3ML IN SOLN
3.0000 mL | Freq: Four times a day (QID) | RESPIRATORY_TRACT | Status: DC
  Administered 2015-05-06 – 2015-05-08 (×7): 3 mL via RESPIRATORY_TRACT
  Filled 2015-05-06 (×9): qty 3

## 2015-05-06 MED ORDER — ALBUTEROL (5 MG/ML) CONTINUOUS INHALATION SOLN
10.0000 mg/h | INHALATION_SOLUTION | Freq: Once | RESPIRATORY_TRACT | Status: AC
  Administered 2015-05-06: 10 mg/h via RESPIRATORY_TRACT

## 2015-05-06 MED ORDER — CHLORHEXIDINE GLUCONATE 0.12 % MT SOLN: 15.0000 mL | Freq: Two times a day (BID) | OROMUCOSAL | Status: DC

## 2015-05-06 MED ORDER — SODIUM CHLORIDE 0.9 % IV SOLN
Freq: Once | INTRAVENOUS | Status: AC
  Administered 2015-05-06: 125 mL/h via INTRAVENOUS

## 2015-05-06 MED ORDER — SODIUM CHLORIDE 0.9 % IV SOLN
INTRAVENOUS | Status: DC
  Administered 2015-05-06: 21:00:00 via INTRAVENOUS

## 2015-05-06 MED ORDER — ANTISEPTIC ORAL RINSE SOLUTION (CORINZ)
7.0000 mL | Freq: Four times a day (QID) | OROMUCOSAL | Status: DC
  Administered 2015-05-07 – 2015-05-09 (×8): 7 mL via OROMUCOSAL

## 2015-05-06 MED ORDER — ALBUTEROL SULFATE (2.5 MG/3ML) 0.083% IN NEBU: 5.0000 mg | INHALATION_SOLUTION | Freq: Once | RESPIRATORY_TRACT | Status: DC

## 2015-05-06 MED ORDER — CHLORHEXIDINE GLUCONATE 0.12 % MT SOLN
15.0000 mL | Freq: Two times a day (BID) | OROMUCOSAL | Status: DC
  Administered 2015-05-06: 15 mL via OROMUCOSAL

## 2015-05-06 MED ORDER — ASPIRIN 300 MG RE SUPP
300.0000 mg | RECTAL | Status: AC
  Administered 2015-05-06: 300 mg via RECTAL

## 2015-05-06 MED ORDER — INSULIN ASPART 100 UNIT/ML ~~LOC~~ SOLN
0.0000 [IU] | SUBCUTANEOUS | Status: DC
  Administered 2015-05-06 (×2): 2 [IU] via SUBCUTANEOUS
  Administered 2015-05-07: 1 [IU] via SUBCUTANEOUS
  Administered 2015-05-07: 2 [IU] via SUBCUTANEOUS
  Administered 2015-05-07 (×2): 1 [IU] via SUBCUTANEOUS
  Filled 2015-05-06: qty 1

## 2015-05-06 MED ORDER — ALBUTEROL (5 MG/ML) CONTINUOUS INHALATION SOLN
10.0000 mg/h | INHALATION_SOLUTION | Freq: Once | RESPIRATORY_TRACT | Status: AC
Start: 2015-05-06 — End: 2015-05-06
  Administered 2015-05-06: 10 mg/h via RESPIRATORY_TRACT
  Filled 2015-05-06: qty 20

## 2015-05-06 MED ORDER — CHLORHEXIDINE GLUCONATE 0.12% ORAL RINSE (MEDLINE KIT)
15.0000 mL | Freq: Two times a day (BID) | OROMUCOSAL | Status: DC
  Administered 2015-05-07 – 2015-05-08 (×3): 15 mL via OROMUCOSAL

## 2015-05-06 MED ORDER — ONDANSETRON HCL 4 MG/2ML IJ SOLN: 4.0000 mg | Freq: Four times a day (QID) | INTRAMUSCULAR | Status: DC | PRN

## 2015-05-06 MED ORDER — SODIUM CHLORIDE 0.9 % IV SOLN
250.0000 mL | INTRAVENOUS | Status: DC | PRN
  Administered 2015-05-06: 250 mL via INTRAVENOUS

## 2015-05-06 MED ORDER — ACETAMINOPHEN 325 MG PO TABS: 650.0000 mg | ORAL_TABLET | ORAL | Status: DC | PRN

## 2015-05-06 NOTE — H&P (Signed)
PULMONARY / CRITICAL CARE MEDICINE   Name: CHRISTEL BAI MRN: [id number] DOB: March 03, 1922    ADMISSION DATE:  05/03/2015 CONSULTATION DATE:  05/05/2015   REFERRING MD:  Dr. Fayrene Fearing   CHIEF COMPLAINT:  Respiratory Distress   HISTORY OF PRESENT ILLNESS:   82 y/o F with a PMH of CAD s/p CABG, HTN, HLD, unspecified disorder of AV, DM II, DJD and 2L O2 dependent COPD who presented to [facility] on 4/17 via EMS with complaints of SOB and wheezing.    The patient reported on presentation that shortness of breath began evening prior to presentation.  Symptoms worsened and she called EMS.  The patient was treated with IV solu-medrol, atrovent and albuterol in route.  Initial labs - Na 144, K 4.1, Sr Cr 1.45 (appears to be baseline), troponin 0.02, BNP 1639, lactic acid 0.97, glucose 159, WBC 12.4, Hgb 10.2, and platelets 225. CXR negative for acute process but showed changes consistent with COPD.  Initial ABG 7.210 / 64.8 / 175 / 25.  The patient was placed on BiPAP.  Follow up ABG 7.152 / 63 / 67 / 22 (felt to be mixed venous per RT).   At baseline, the patient lives alone.  She has grandchildren that come in to stay with her occasionally.  She also has home health support.  Her granddaughter reports she has been less active in the last 3 days.  She mostly sits in her chair (a hover round).  Requires help with ADL's and obtaining food.  She has also been eating / drinking less in the last three days. Denies known sick contacts, nausea, vomiting, diarrhea, nasal drainage, cough, sputum production, chest pain, pain with inspiration.    Patient currently reports improvement in work of breathing. Reports SOB initially but better on bipap.   PCCM called for admission.   PAST MEDICAL HISTORY :  She  has a past medical history of Coronary atherosclerosis of autologous vein bypass graft; Aortic valve disorders; Unspecified essential hypertension; Dyslipidemia; Type II or unspecified type diabetes mellitus without mention  of complication, not stated as uncontrolled; DJD (degenerative joint disease); and COPD (chronic obstructive pulmonary disease) (HCC).  PAST SURGICAL HISTORY: She  has past surgical history that includes Coronary artery bypass graft.  Allergies  Allergen Reactions  . Ace Inhibitors Nausea Only  . Penicillins Hives and Rash    No current facility-administered medications on file prior to encounter.   Current Outpatient Prescriptions on File Prior to Encounter  Medication Sig  . albuterol (PROVENTIL HFA;VENTOLIN HFA) 108 (90 Base) MCG/ACT inhaler Inhale 2 puffs into the lungs every 6 (six) hours as needed for wheezing or shortness of breath.  Marland Kitchen albuterol (PROVENTIL) (2.5 MG/3ML) 0.083% nebulizer solution USE 1 VIAL IN NEBULZIER EVERY 4 TO 6 HOURS AS NEEDED FOR SHORTNESS OF BREATH OR WHEEZING  . allopurinol (ZYLOPRIM) 100 MG tablet Take 100 mg by mouth daily.   Marland Kitchen amLODipine (NORVASC) 10 MG tablet Take 10 mg by mouth daily.    Marland Kitchen aspirin 81 MG tablet Take 81 mg by mouth daily.    . calcitRIOL (ROCALTROL) 0.25 MCG capsule Take 0.25 mcg by mouth daily.   Marland Kitchen docusate sodium (COLACE) 100 MG capsule Take 100 mg by mouth 2 (two) times daily as needed for mild constipation.  . ferrous sulfate 325 (65 FE) MG tablet Take 325 mg by mouth 2 (two) times daily.    . furosemide (LASIX) 20 MG tablet Take 1 tablet (20 mg total) by mouth 2 (two)  times daily.  . metoprolol tartrate (LOPRESSOR) 25 MG tablet TAKE 1 TABLET BY MOUTH TWICE A DAY  . nitroGLYCERIN (NITROSTAT) 0.4 MG SL tablet Place 0.4 mg under the tongue every 5 (five) minutes x 3 doses as needed for chest pain.   Marland Kitchen NOVOLIN 70/30 (70-30) 100 UNIT/ML injection Inject 5 Units into the skin 2 (two) times daily with a meal.     FAMILY HISTORY:  Her indicated that her mother is deceased. She indicated that her father is deceased. She indicated that her sister is alive. She indicated that both of her brothers are deceased.   SOCIAL HISTORY: She   reports that she has quit smoking. She does not have any smokeless tobacco history on file. She reports that she does not drink alcohol or use illicit drugs.  REVIEW OF SYSTEMS:   Unable to complete with patient as she is on BiPAP.   SUBJECTIVE:   VITAL SIGNS: BP 95/61 mmHg  Pulse 100  Temp(Src) 97.8 F (36.6 C) (Rectal)  Resp 19  SpO2 100%  LMP  (LMP Unknown)  HEMODYNAMICS:    VENTILATOR SETTINGS: Vent Mode:  [-] BIPAP FiO2 (%):  [40 %] 40 % PEEP:  [5 cmH20] 5 cmH20 Pressure Support:  [5 cmH20] 5 cmH20  INTAKE / OUTPUT:    PHYSICAL EXAMINATION: General:  Frail elderly female on BiPAP in NAD Neuro:  AAOx4, speech clear, generalized weakness but MAE HEENT:  MM pink/moist, unable to appreciate JVD  Cardiovascular:  s1s2 rrr, no m/r/g, distant tones  Lungs:  Even/non-labored on bipap, prolonged respiratory phase, soft wheezing bilaterally  Abdomen:  Soft, non-tender, bsx4 active  Musculoskeletal:  No acute deformities  Skin:  Warm/dry, trace to 1+ BLE pitting edema, changes c/w chronic venous stasis  LABS:  BMET  Recent Labs Lab 2015-05-12 1110  NA 144  K 4.1  CL 110  CO2 24  BUN 19  CREATININE 1.45*  GLUCOSE 159*    Electrolytes  Recent Labs Lab 05/12/15 1110  CALCIUM 8.8*    CBC  Recent Labs Lab 2015/05/12 1110  WBC 12.4*  HGB 10.2*  HCT 34.8*  PLT 225    Coag's No results for input(s): APTT, INR in the last 168 hours.  Sepsis Markers  Recent Labs Lab 12-May-2015 1122  LATICACIDVEN 0.97    ABG  Recent Labs Lab 05/12/15 1214  PHART 7.210*  PCO2ART 64.8*  PO2ART 175.0*    Liver Enzymes No results for input(s): AST, ALT, ALKPHOS, BILITOT, ALBUMIN in the last 168 hours.  Cardiac Enzymes No results for input(s): TROPONINI, PROBNP in the last 168 hours.  Glucose No results for input(s): GLUCAP in the last 168 hours.  Imaging Dg Chest Portable 1 View  12-May-2015  CLINICAL DATA:  Shortness of Breath EXAM: PORTABLE CHEST 1 VIEW  COMPARISON:  04/22/2015 FINDINGS: Cardiac shadow is stable. Pacing device is again seen. Postsurgical changes are again noted. The lungs are well aerated bilaterally in demonstrates some decreased markings in the apices consistent with COPD. Stable bilateral interstitial changes are again noted. No focal confluent infiltrate is seen. A sizable effusion is noted. IMPRESSION: Stable changes similar to that seen on the prior exam. COPD. No acute abnormality is noted. Electronically Signed   By: Alcide Clever M.D.   On: 12-May-2015 12:04     STUDIES:    CULTURES:   ANTIBIOTICS:   SIGNIFICANT EVENTS: 4/17  Admit with AECOPD   LINES/TUBES:   DISCUSSION: 82 y/o F with PMH of O2 dependent COPD  [facility] with acute onset SOB and hypotension.    ASSESSMENT / PLAN:  PULMONARY A: Acute Respiratory Acidosis  AE-COPD  Baseline 2L O2 Dependent COPD  P:   Admit to ICU for close respiratory observation  Family requests full code / intubation for now Repeat ABG at 1600  O2 to support saturations 88-95% Solumedrol 40 BID  Duoneb Q6 with Q3 PRN albuterol   CARDIOVASCULAR A:  Hypotension - resolving with IVF, poor PO intake in last few days Hx HTN, HLD, CAD s/p CABG, mild AV outflow tract obstruction  P:  Gentle hydration, NS @ 40 ml/hr with cardiac hx  ICU monitoring  Full code - encouraged family to discuss with patient / amongst group given advanced age Assess troponin  Trend BMP  Hold home norvasc, lasix, metoprolol  RENAL A:   No acute issues - appears to be at baseline sr cr  P:   Trend BMP  Monitor UOP  Replace electrolytes as indicated  Consider restart calcitriol in am 4/18  GASTROINTESTINAL A:   Protein Calorie Malnutrition  P:   NPO for now with BiPAP use Advance diet as tolerated once off bipap   HEMATOLOGIC A:   Mild Anemia  P:  Monitor CBC Hold home ferrous sulfate for now Heparin SQ for DVT prophylaxis   INFECTIOUS A:   AECOPD  P:    Monitor off abx  ENDOCRINE A:   DM Mellitus II  Gout  P:   CBG Q4 with SSI  Hold home gout meds  NEUROLOGIC A:   No acute issues  P:   RASS goal: n/a Minimize sedating medications    FAMILY  - Updates: Granddaughter updated extensively at bedside.  Hopeful all issues are reversible.  Due to advanced age and functional status, we discussed EOL issues.  Recommended continue full medical care but no CPR or intubation in the event of arrest.  Granddaughter agreed but wants to speak with rest of family before making any decisions.  FULL CODE for now.    - Inter-disciplinary family meet or Palliative Care meeting due by:  4/24    Canary BrimBrandi Jamyla Ard, NP-C [facility] Pgr: [phone] or if no answer [phone] 04/22/2015, 1:24 PM

## 2015-05-06 NOTE — ED Provider Notes (Signed)
CSN: [id number]     Arrival date & time 05/13/2015  1056 History   First MD Initiated Contact with Patient 04/23/2015 1102     Chief Complaint  Patient presents with  . 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 Patient states she began having difficulty breathing last night and took multiple home nebulizer treatments without relief. This point when she woke up she had extreme difficulty breathing with audible wheezes. No reported chest pain, loss of consciousness, dizziness. Patient is alert and oriented in response to verbal stimuli. In route to ED, she was given 125 mg Solu-Medrol and 2 albuterol treatments by EMS without relief. Patient was recently hospitalized to be scoped for pneumonia and was sent home on 2 L home O2. This morning she was saturating at 89% on her home O2. After the albuterol treatments she was back at 100% per EMS.   Past Medical History  Diagnosis Date  . Coronary atherosclerosis of autologous vein bypass graft   . 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 Unspecified essential hypertension   . Dyslipidemia   . Type II or unspecified type diabetes mellitus without mention of complication, not stated as uncontrolled   . DJD (degenerative joint disease)   . COPD (chronic obstructive pulmonary disease) [facility](HCC)    Past Surgical History  Procedure Laterality Date  . Coronary artery bypass graft      x4 in 1998. She had LIMA to LAD, saphenous vein graft to PDA, saphenous vein graft to diagonal #1, and saphenous vein graft to OM-1.    Family History  Problem Relation Age of Onset  . Cancer Mother   . Heart Problems Brother   . Cancer Brother   . Heart attack  Sister   . Heart attack Daughter   . Heart Problems Brother    Social History  Substance Use Topics  . Smoking status: Former Games developermoker  . Smokeless tobacco: None     Comment: quit in 1998. she had smoked 1.5 ppd for 30 years   . Alcohol Use: No   OB History    No data available     Review of Systems  All other systems reviewed and are negative.     Allergies  Ace inhibitors and Penicillins  Home Medications   Prior to Admission medications   Medication Sig Start Date End Date Taking? Authorizing Provider  albuterol (PROVENTIL HFA;VENTOLIN HFA) 108 (90 Base) MCG/ACT inhaler Inhale 2 puffs into the lungs every 6 (six) hours as needed for wheezing or shortness of breath.    Historical Provider, MD  albuterol (PROVENTIL) (2.5 MG/3ML) 0.083% nebulizer solution USE 1 VIAL IN NEBULZIER EVERY 4 TO 6 HOURS AS NEEDED FOR SHORTNESS OF BREATH OR WHEEZING 08/15/14   Historical Provider, MD  allopurinol (ZYLOPRIM) 100 MG tablet Take 100 mg by mouth daily.  10/03/12   Historical Provider, MD  amLODipine (NORVASC) 10 MG tablet Take 10 mg by mouth daily.      Historical Provider, MD  aspirin 81 MG tablet Take 81 mg by mouth daily.      Historical Provider, MD  calcitRIOL (ROCALTROL) 0.25 MCG capsule Take 0.25 mcg by mouth daily.  09/28/12   Historical Provider, MD  docusate sodium (  COLACE) 100 MG capsule Take 100 mg by mouth 2 (two) times daily as needed for mild constipation.    Historical Provider, MD  ferrous sulfate 325 (65 FE) MG tablet Take 325 mg by mouth 2 (two) times daily.      Historical Provider, MD  furosemide (LASIX) 20 MG tablet Take 1 tablet (20 mg total) by mouth 2 (two) times daily. 04/22/15   Trixie Dredge, PA-C  metoprolol tartrate (LOPRESSOR) 25 MG tablet TAKE 1 TABLET BY MOUTH TWICE A DAY 10/17/11   Tonny Bollman, MD  nitroGLYCERIN (NITROSTAT) 0.4 MG SL tablet Place 0.4 mg under the tongue every 5 (five) minutes x 3 doses as needed for chest pain.     Historical Provider, MD   NOVOLIN 70/30 (70-30) 100 UNIT/ML injection Inject 5 Units into the skin 2 (two) times daily with a meal.  06/09/13   Historical Provider, MD   BP 76/52 mmHg  Pulse 102  Temp(Src) 97.8 F (36.6 C) (Rectal)  Resp 20  SpO2 100%  LMP  (LMP Unknown) Physical Exam  Constitutional: She is oriented to person, place, and time. She appears distressed.  Ill appearing, frail. lethargic  HENT:  Head: Normocephalic and atraumatic.  Mouth/Throat: No oropharyngeal exudate.  Eyes: Conjunctivae and EOM are normal. Pupils are equal, round, and reactive to light. Right eye exhibits no discharge. Left eye exhibits no discharge. No scleral icterus.  Cardiovascular: Normal rate, regular rhythm, normal heart sounds and intact distal pulses.  Exam reveals no gallop and no friction rub.   No murmur heard. Pulmonary/Chest: She is in respiratory distress. She has wheezes ( inspiratory and expiratory throughout). She has no rales. She exhibits no tenderness.  Abdominal: Soft. She exhibits no distension. There is no tenderness. There is no guarding.  Musculoskeletal: Normal range of motion. She exhibits no edema.  Neurological: She is alert and oriented to person, place, and time. No cranial nerve deficit.  Skin: Skin is warm and dry. No rash noted. She is not diaphoretic. No erythema. No pallor.  Psychiatric: She has a normal mood and affect. Her behavior is normal.  Nursing note and vitals reviewed.   ED Course  Procedures (including critical care time)  CRITICAL CARE Performed by: Dub Mikes   Total critical care time: 60 minutes  Critical care time was exclusive of separately billable procedures and treating other patients.  Critical care was necessary to treat or prevent imminent or life-threatening deterioration.  Critical care was time spent personally by me on the following activities: development of treatment plan with patient and/or surrogate as well as nursing, discussions with  consultants, evaluation of patient's response to treatment, examination of patient, obtaining history from patient or surrogate, ordering and performing treatments and interventions, ordering and review of laboratory studies, ordering and review of radiographic studies, pulse oximetry and re-evaluation of patient's condition.  Labs Review Labs Reviewed  BASIC METABOLIC PANEL - Abnormal; Notable for the following:    Glucose, Bld 159 (*)    Creatinine, Ser 1.45 (*)    Calcium 8.8 (*)    GFR calc non Af Amer 30 (*)    GFR calc Af Amer 35 (*)    All other components within normal limits  CBC WITH DIFFERENTIAL/PLATELET - Abnormal; Notable for the following:    WBC 12.4 (*)    Hemoglobin 10.2 (*)    HCT 34.8 (*)    MCH 24.3 (*)    MCHC 29.3 (*)    Neutro Abs 10.3 (*)  All other components within normal limits  I-STAT ARTERIAL BLOOD GAS, ED - Abnormal; Notable for the following:    pH, Arterial 7.210 (*)    pCO2 arterial 64.8 (*)    pO2, Arterial 175.0 (*)    Bicarbonate 25.9 (*)    Acid-base deficit 3.0 (*)    All other components within normal limits  BRAIN NATRIURETIC PEPTIDE  BLOOD GAS, ARTERIAL  I-STAT TROPOININ, ED  I-STAT CG4 LACTIC ACID, ED    Imaging Review Dg Chest Portable 1 View  04/25/2015  CLINICAL DATA:  Shortness of Breath EXAM: PORTABLE CHEST 1 VIEW COMPARISON:  04/22/2015 FINDINGS: Cardiac shadow is stable. Pacing device is again seen. Postsurgical changes are again noted. The lungs are well aerated bilaterally in demonstrates some decreased markings in the apices consistent with COPD. Stable bilateral interstitial changes are again noted. No focal confluent infiltrate is seen. A sizable effusion is noted. IMPRESSION: Stable changes similar to that seen on the prior exam. COPD. No acute abnormality is noted. Electronically Signed   By: Alcide Clever M.D.   On: 04/21/2015 12:04   I have personally reviewed and evaluated these images and lab results as part of my  medical decision-making.   EKG Interpretation   Date/Time:  Monday May 06 2015 11:24:54 EDT Ventricular Rate:  97 PR Interval:  188 QRS Duration: 157 QT Interval:  403 QTC Calculation: 512 R Axis:   -88 Text Interpretation:  Ventricular-paced rhythm Reconfirmed by Fayrene Fearing  MD,  MARK ([id number]) on 04/24/2015 11:43:47 AM      MDM   Final diagnoses:  Respiratory distress  COPD exacerbation (HCC)    82 year old female presents to the ED in respiratory distress likely due to COPD exacerbation. Inspiratory and expiratory wheezes heard throughout all lung fields. No crackles on lung exam or evidence of fluid overload. No peripheral edema. Chest x-ray reveals COPD-like changes. No pulmonary edema evident. Patient is alert and oriented but is having severe labored breathing. Accessory muscle usage present with retractions. Patient is afebrile. Lactic acid within normal limits no sign of pneumonia. Doubt infection. EKG appears to be paced. Doubt MI. No chest pain. Initial troponin 0.02. Acute respiratory acidosis seen on ABG. Patient was given 2 albuterol treatments and 125 mg of Solu-Medrol in route by EMS. While in the ED she was given continuous neb which did not improve her symptoms. She was placed on BiPAP. Patient appears to be stable on BiPAP. We'll defer intubation at this time. Patient is a full code. ABG repeated after an hour. However, results appear to be skewed. Concern that this might be mixed venous. We'll repeat. Critical care consultation patient in ED and will admit to their service. Patient is currently saturating at 100% on BiPAP. Blood pressure is mildly low but stable and receiving continuous IV fluids.  Patient was discussed with and seen by Dr. Fayrene Fearing who agrees with the treatment plan.      Lester Kinsman [place], PA-C 04/25/2015 1521  Rolland Porter, MD 05/14/15 1459

## 2015-05-06 NOTE — ED Provider Notes (Addendum)
Patient seen and evaluated. Discussed at length, and evaluated with Gaylyn RongSamantha Dowless PA-C. Pt in respiratory distress.  Is s/p continuous nebulized albuterol in route with 125 IV site Medrol. I agree with assessment of COPD exacerbation. No crackles. Has only trace symmetric lower extremity edema. Chest x-ray does not show effusion or CHF or infiltrate. Agree with BiPAP. ABG obtained shows acute respiratory acidosis.  Per Discussion with pt and Niece, by myself and S. Dowless PA-C, pt is a full code including intubation, PCR, vasoactive meds prn etc.    Bipap initiated.  Intensivist notified. They'll see the patient when able. Plan will be fluid boluses for her hypotension, BiPAP, reassessment, repeat ABG, positive pressure ventilation as needed. We'll reassess.  CRITICAL CARE Performed by: Rolland PorterJAMES, Kinan Safley JOSEPH   Total critical care time: 60 minutes  Critical care time was exclusive of separately billable procedures and treating other patients.  Critical care was necessary to treat or prevent imminent or life-threatening deterioration.  Critical care was time spent personally by me on the following activities: development of treatment plan with patient and/or surrogate as well as nursing, discussions with consultants, evaluation of patient's response to treatment, examination of patient, obtaining history from patient or surrogate, ordering and performing treatments and interventions, ordering and review of laboratory studies, ordering and review of radiographic studies, pulse oximetry and re-evaluation of patient's condition.   Rolland PorterMark Nance Mccombs, MD 04/24/2015 1309  Rolland PorterMark Chennel Olivos, MD 05/14/15 1500

## 2015-05-06 NOTE — ED Notes (Signed)
Pt in via [facility] c/o SOB onset last night with wheezing & SOB, pt uses 2 L [place] at baseline hx of [place], pt rcvd 125 mg Solu Medrol pta, pt rcvd 1 mg Atrovent & 2.5 mg Albuterol pta, pt A&O x4

## 2015-05-06 NOTE — Progress Notes (Signed)
CRITICAL VALUE ALERT  Critical value received:  Troponin 0.06  Date of notification:  February 15, 2015  Time of notification:  2152  Critical value read back:Yes.    Nurse who received alert:  Gwendloyn Forsee Thompson  MD notified (1st page):  Wilson  Time of first page:  2152  MD notified (2nd page): N/a  Time of second page: N/a  Responding MD:  N/a  Time MD responded:  N/a

## 2015-05-06 NOTE — Progress Notes (Signed)
eLink Physician-Brief Progress Note Patient Name: [name] MowersSarah E [name] DOB: 06/06/1922 MRN: [id number]   Date of Service  05/01/2015  HPI/Events of Note  Last ABG on BiPAP = 7.197/50.9/97.0  eICU Interventions  Will order F/U ABG for 11 PM.     Intervention Category Major Interventions: Acid-Base disturbance - evaluation and management;Respiratory failure - evaluation and management  Lenell AntuSommer,Steven Eugene 04/29/2015, 8:39 PM

## 2015-05-06 NOTE — Progress Notes (Signed)
Placed patient on BiPAP 5/5 40% with Servo I.

## 2015-05-07 ENCOUNTER — Inpatient Hospital Stay (HOSPITAL_COMMUNITY): Payer: Medicare Other

## 2015-05-07 DIAGNOSIS — R0600 Dyspnea, unspecified: Secondary | ICD-10-CM

## 2015-05-07 DIAGNOSIS — R0603 Acute respiratory distress: Secondary | ICD-10-CM | POA: Insufficient documentation

## 2015-05-07 DIAGNOSIS — J9600 Acute respiratory failure, unspecified whether with hypoxia or hypercapnia: Secondary | ICD-10-CM

## 2015-05-07 LAB — BASIC METABOLIC PANEL
ANION GAP: 15 (ref 5–15)
Anion gap: 12 (ref 5–15)
Anion gap: 14 (ref 5–15)
BUN: 23 mg/dL — AB (ref 6–20)
BUN: 33 mg/dL — ABNORMAL HIGH (ref 6–20)
BUN: 34 mg/dL — ABNORMAL HIGH (ref 6–20)
CALCIUM: 8.1 mg/dL — AB (ref 8.9–10.3)
CALCIUM: 8.5 mg/dL — AB (ref 8.9–10.3)
CHLORIDE: 113 mmol/L — AB (ref 101–111)
CO2: 20 mmol/L — AB (ref 22–32)
CO2: 16 mmol/L — AB (ref 22–32)
CO2: 17 mmol/L — ABNORMAL LOW (ref 22–32)
CREATININE: 1.61 mg/dL — AB (ref 0.44–1.00)
Calcium: 8.5 mg/dL — ABNORMAL LOW (ref 8.9–10.3)
Chloride: 115 mmol/L — ABNORMAL HIGH (ref 101–111)
Chloride: 115 mmol/L — ABNORMAL HIGH (ref 101–111)
Creatinine, Ser: 1.68 mg/dL — ABNORMAL HIGH (ref 0.44–1.00)
Creatinine, Ser: 1.72 mg/dL — ABNORMAL HIGH (ref 0.44–1.00)
GFR calc Af Amer: 30 mL/min — ABNORMAL LOW (ref >60)
GFR calc non Af Amer: 27 mL/min — ABNORMAL LOW (ref >60)
GFR calc non Af Amer: 26 mL/min — ABNORMAL LOW (ref >60)
GFR, EST AFRICAN AMERICAN: 31 mL/min — AB (ref >60)
GFR, EST AFRICAN AMERICAN: 29 mL/min — AB (ref >60)
GFR, EST NON AFRICAN AMERICAN: 25 mL/min — AB (ref >60)
GLUCOSE: 162 mg/dL — AB (ref 65–99)
Glucose, Bld: 160 mg/dL — ABNORMAL HIGH (ref 65–99)
Glucose, Bld: 162 mg/dL — ABNORMAL HIGH (ref 65–99)
POTASSIUM: 5.5 mmol/L — AB (ref 3.5–5.1)
Potassium: 3.7 mmol/L (ref 3.5–5.1)
Potassium: 4.5 mmol/L (ref 3.5–5.1)
Sodium: 145 mmol/L (ref 135–145)
Sodium: 146 mmol/L — ABNORMAL HIGH (ref 135–145)
Sodium: 146 mmol/L — ABNORMAL HIGH (ref 135–145)

## 2015-05-07 LAB — GLUCOSE, CAPILLARY
GLUCOSE-CAPILLARY: 139 mg/dL — AB (ref 65–99)
GLUCOSE-CAPILLARY: 148 mg/dL — AB (ref 65–99)
Glucose-Capillary: 112 mg/dL — ABNORMAL HIGH (ref 65–99)
Glucose-Capillary: 124 mg/dL — ABNORMAL HIGH (ref 65–99)
Glucose-Capillary: 131 mg/dL — ABNORMAL HIGH (ref 65–99)
Glucose-Capillary: 167 mg/dL — ABNORMAL HIGH (ref 65–99)

## 2015-05-07 LAB — POCT I-STAT 3, ART BLOOD GAS (G3+)
ACID-BASE DEFICIT: 8 mmol/L — AB (ref 0.0–2.0)
Acid-base deficit: 7 mmol/L — ABNORMAL HIGH (ref 0.0–2.0)
BICARBONATE: 21.4 meq/L (ref 20.0–24.0)
Bicarbonate: 19.8 mEq/L — ABNORMAL LOW (ref 20.0–24.0)
O2 SAT: 89 %
O2 Saturation: 95 %
PH ART: 7.225 — AB (ref 7.350–7.450)
PH ART: 7.198 — AB (ref 7.350–7.450)
TCO2: 23 mmol/L (ref 0–100)
TCO2: 21 mmol/L (ref 0–100)
pCO2 arterial: 51.1 mmHg — ABNORMAL HIGH (ref 35.0–45.0)
pCO2 arterial: 50.5 mmHg — ABNORMAL HIGH (ref 35.0–45.0)
pO2, Arterial: 91 mmHg (ref 80.0–100.0)
pO2, Arterial: 68 mmHg — ABNORMAL LOW (ref 80.0–100.0)

## 2015-05-07 LAB — CBC
HCT: 33.2 % — ABNORMAL LOW (ref 36.0–46.0)
HEMOGLOBIN: 9.5 g/dL — AB (ref 12.0–15.0)
MCH: 24.1 pg — AB (ref 26.0–34.0)
MCHC: 28.6 g/dL — AB (ref 30.0–36.0)
MCV: 84.3 fL (ref 78.0–100.0)
Platelets: 201 10*3/uL (ref 150–400)
RBC: 3.94 MIL/uL (ref 3.87–5.11)
RDW: 15.3 % (ref 11.5–15.5)
WBC: 6.1 10*3/uL (ref 4.0–10.5)

## 2015-05-07 LAB — BRAIN NATRIURETIC PEPTIDE: B NATRIURETIC PEPTIDE 5: 3110.1 pg/mL — AB (ref 0.0–100.0)

## 2015-05-07 LAB — TROPONIN I
TROPONIN I: 0.16 ng/mL — AB (ref <0.031)
TROPONIN I: 0.24 ng/mL — AB (ref <0.031)

## 2015-05-07 MED ORDER — SODIUM CHLORIDE 0.9 % IV SOLN
INTRAVENOUS | Status: DC
  Filled 2015-05-07: qty 2.5

## 2015-05-07 MED ORDER — ASPIRIN 81 MG PO CHEW: 81.0000 mg | CHEWABLE_TABLET | Freq: Every day | ORAL | Status: DC

## 2015-05-07 MED ORDER — METHYLPREDNISOLONE SODIUM SUCC 40 MG IJ SOLR
40.0000 mg | Freq: Four times a day (QID) | INTRAMUSCULAR | Status: DC
  Administered 2015-05-07 – 2015-05-08 (×4): 40 mg via INTRAVENOUS
  Filled 2015-05-07 (×5): qty 1

## 2015-05-07 MED ORDER — SODIUM CHLORIDE 0.9 % IV BOLUS (SEPSIS)
250.0000 mL | Freq: Once | INTRAVENOUS | Status: AC
  Administered 2015-05-07: 250 mL via INTRAVENOUS

## 2015-05-07 MED ORDER — ASPIRIN EC 81 MG PO TBEC
81.0000 mg | DELAYED_RELEASE_TABLET | Freq: Every day | ORAL | Status: DC
  Filled 2015-05-07: qty 1

## 2015-05-07 MED ORDER — FUROSEMIDE 10 MG/ML IJ SOLN
80.0000 mg | Freq: Three times a day (TID) | INTRAMUSCULAR | Status: DC
  Administered 2015-05-07 (×3): 80 mg via INTRAVENOUS
  Filled 2015-05-07 (×4): qty 8

## 2015-05-07 MED ORDER — FAMOTIDINE IN NACL 20-0.9 MG/50ML-% IV SOLN
20.0000 mg | INTRAVENOUS | Status: DC
  Administered 2015-05-07 – 2015-05-08 (×2): 20 mg via INTRAVENOUS
  Filled 2015-05-07 (×3): qty 50

## 2015-05-07 NOTE — Progress Notes (Addendum)
PULMONARY / CRITICAL CARE MEDICINE   Name: MADELIENE TEJERA MRN: [id number] DOB: April 14, 1922    ADMISSION DATE:  05/14/2015 CONSULTATION DATE:  05/04/2015   REFERRING MD:  Dr. Fayrene Fearing   CHIEF COMPLAINT:  Respiratory Distress   HISTORY OF PRESENT ILLNESS:   82 y/o F with a PMH of CAD s/p CABG, HTN, HLD, unspecified disorder of AV, DM II, DJD and 2L O2 dependent COPD who presented to [facility] on 4/17 via EMS with complaints of SOB and wheezing.    The patient reported on presentation that shortness of breath began evening prior to presentation.  Symptoms worsened and she called EMS.  The patient was treated with IV solu-medrol, atrovent and albuterol in route.  Initial labs - Na 144, K 4.1, Sr Cr 1.45 (appears to be baseline), troponin 0.02, BNP 1639, lactic acid 0.97, glucose 159, WBC 12.4, Hgb 10.2, and platelets 225. CXR negative for acute process but showed changes consistent with COPD.  Initial ABG 7.210 / 64.8 / 175 / 25.  The patient was placed on BiPAP.  Follow up ABG 7.152 / 63 / 67 / 22 (felt to be mixed venous per RT).   At baseline, the patient lives alone.  She has grandchildren that come in to stay with her occasionally.  She also has home health support.  Her granddaughter reports she has been less active in the last 3 days.  She mostly sits in her chair (a hover round).  Requires help with ADL's and obtaining food.  She has also been eating / drinking less in the last three days. Denies known sick contacts, nausea, vomiting, diarrhea, nasal drainage, cough, sputum production, chest pain, pain with inspiration.    Patient currently reports improvement in work of breathing. Reports SOB initially but better on bipap.   PCCM called for admission.   SUBJECTIVE:  Patient agitated this morning. Complaining of not getting any air, trying to pull of BiPAP mask.   VITAL SIGNS: BP 116/61 mmHg  Pulse 104  Temp(Src) 97.6 F (36.4 C) (Oral)  Resp 17  Wt 101 lb 6.6 oz (46 kg)  SpO2 99%  LMP  (LMP  Unknown)  HEMODYNAMICS:    VENTILATOR SETTINGS: Vent Mode:  [-] BIPAP FiO2 (%):  [21 %-50 %] 50 % PEEP:  [5 cmH20-8 cmH20] 8 cmH20 Pressure Support:  [5 cmH20-12 cmH20] 12 cmH20  INTAKE / OUTPUT: I/O last 3 completed shifts: In: 1020 [I.V.:1020] Out: 10 [Urine:10]  PHYSICAL EXAMINATION: General:  Frail elderly female on BiPAP in NAD Neuro:  AAOx4, speech clear, generalized weakness but MAE HEENT:  MM pink/moist, unable to appreciate JVD  Cardiovascular:  s1s2 rrr, no m/r/g, distant tones  Lungs:  Even/non-labored on bipap, prolonged respiratory phase, diffuse wheezing bilaterally  Abdomen:  Soft, non-tender, bsx4 active  Musculoskeletal:  No acute deformities  Skin:  Warm/dry, trace to 1+ BLE pitting edema, changes c/w chronic venous stasis  LABS:  BMET  Recent Labs Lab 04/27/2015 1110 05/07/15 0219  NA 144 145  K 4.1 3.7  CL 110 113*  CO2 24 20*  BUN 19 23*  CREATININE 1.45* 1.61*  GLUCOSE 159* 160*    Electrolytes  Recent Labs Lab 05/10/2015 1110 05/07/15 0219  CALCIUM 8.8* 8.1*    CBC  Recent Labs Lab 05/17/2015 1110 05/07/15 0219  WBC 12.4* 6.1  HGB 10.2* 9.5*  HCT 34.8* 33.2*  PLT 225 201    Coag's No results for input(s): APTT, INR in the last 168 hours.  Sepsis  Markers  Recent Labs Lab October 26, 2015 1122  LATICACIDVEN 0.97    ABG  Recent Labs Lab October 26, 2015 2324 05/07/15 0230 05/07/15 0753  PHART 7.268* 7.225* 7.198*  PCO2ART 45.6* 51.1* 50.5*  PO2ART 70.0* 91.0 68.0*    Liver Enzymes No results for input(s): AST, ALT, ALKPHOS, BILITOT, ALBUMIN in the last 168 hours.  Cardiac Enzymes  Recent Labs Lab October 26, 2015 1703 October 26, 2015 2000 05/07/15 0219  TROPONINI 0.03 0.06* 0.16*    Glucose  Recent Labs Lab October 26, 2015 1642 October 26, 2015 2003 October 26, 2015 2349 05/07/15 0342  GLUCAP 179* 176* 167* 139*    Imaging Dg Chest Port 1 View  05/07/2015  CLINICAL DATA:  Shortness of breath, COPD exacerbation, coronary artery disease, CABG,  CHF, valvular heart disease. EXAM: PORTABLE CHEST 1 VIEW COMPARISON:  Portable chest x-ray of May 06, 2015 FINDINGS: The lungs remain hyperinflated. Worsening of confluent interstitial and early alveolar opacities bilaterally is noted. The heart is mildly enlarged. The pulmonary vascularity is not clearly engorged. There are post CABG changes. The permanent pacemaker is in reasonable position. There is calcification of the mitral valvular annulus. IMPRESSION: Mild interval deterioration of the pulmonary interstitium consistent with worsening pneumonia. There is probable underlying low-grade compensated CHF. There is underlying COPD. Electronically Signed   By: David  Swaziland[place] M.D.   On: 05/07/2015 07:11   Dg Chest Portable 1 View  03/16/15  CLINICAL DATA:  Shortness of Breath EXAM: PORTABLE CHEST 1 VIEW COMPARISON:  04/22/2015 FINDINGS: Cardiac shadow is stable. Pacing device is again seen. Postsurgical changes are again noted. The lungs are well aerated bilaterally in demonstrates some decreased markings in the apices consistent with COPD. Stable bilateral interstitial changes are again noted. No focal confluent infiltrate is seen. A sizable effusion is noted. IMPRESSION: Stable changes similar to that seen on the prior exam. COPD. No acute abnormality is noted. Electronically Signed   By: Alcide CleverMark  Lukens M.D.   On: 002/25/17 12:04   STUDIES:    CULTURES:   ANTIBIOTICS:   SIGNIFICANT EVENTS: 4/17  Admit with AECOPD   LINES/TUBES:   DISCUSSION: 82 y/o F with PMH of O2 dependent COPD [facility] with acute onset SOB and hypotension.    ASSESSMENT / PLAN:  PULMONARY A: Acute Respiratory Acidosis  AE-COPD  Baseline 2L O2 Dependent COPD  pulm edema increasing? P:   Repeat ABG this morning worsening, 7.19/51/71/19.8 O2 to support saturations 88-95% Solumedrol 40 BID, may need increase Duoneb Q6 with Q3 PRN albuterol  Need neg balance Will re discuss intubation pcxr  in am   CARDIOVASCULAR A:  Hypotension - resolving with IVF, poor PO intake in last few days Hx HTN, HLD, CAD s/p CABG, mild AV outflow tract obstruction, dCHF Troponin leak - likely demand mild Likely chf, diastolic acute? P:  kvo ICU monitoring  Full code - encouraged family to discuss with patient / amongst group given advanced age Trend BMP  Hold home norvasc, metoprolol ECHO opn order Lasix , see pulm , worsening pcxr c/w edema 'NIMV required need some interuption  RENAL A:   No acute issues - appears to be at baseline sr cr  Lasix dependent at home pulm edema P:   Trend BMP  Minimal UOP, Monitor  Replace electrolytes as indicated  lasix bmet in 8 pm   GASTROINTESTINAL A:   Protein Calorie Malnutrition  P:   NPO for now with BiPAP use pepcid  HEMATOLOGIC A:   Mild Anemia  P:  Monitor CBC Hold home  ferrous sulfate for now Heparin SQ for DVT prophylaxis   INFECTIOUS A:   AECOPD  P:   Monitor off abx  ENDOCRINE A:   DM Mellitus II  Gout  P:   CBG Q4 with SSI  Hold home gout meds  NEUROLOGIC A:   aggitation /resp failure, NIMV need P:   RASS goal: n/a Minimize sedating medications  NO benzo  FAMILY  - Updates: feinstein update dfmaily    - Inter-disciplinary family meet or Palliative Care meeting due by:  4/24   Valentino Nose, MD IMTS PGY-1 [phone]   STAFF NOTE: I, Rory Percy, MD FACP have personally reviewed patient's available data, including medical history, events of note, physical examination and test results as part of my evaluation. I have discussed with resident/NP and other care providers such as pharmacist, RN and RRT. In addition, I personally evaluated patient and elicited key findings of:  Crackles, distant, wheezing on exam , on NIMV, moderate distress, pcxr worsening with int edema ? Fluid fissure?, trop neg overall, echo sent, concern is CHF with copd, nimv is temporary, need interuption min 30 min to avoid  facial trauma, pcxr in am , must start lasix, follow crt trend, may need cvp assessment, neg balance is goal, abg reviewed, will discuss again ramifications of intubation, consider DNR DNI, if want heroic care likley to require intubation this am , npo, no abx, steroid increase, maintain BDers, I updated family in full The patient is critically ill with multiple organ systems failure and requires high complexity decision making for assessment and support, frequent evaluation and titration of therapies, application of advanced monitoring technologies and extensive interpretation of multiple databases.   Critical Care Time devoted to patient care services described in this note is 35 Minutes. This time reflects time of care of this signee: Rory Percy, MD FACP. This critical care time does not reflect procedure time, or teaching time or supervisory time of PA/NP/Med student/Med Resident etc but could involve care discussion time. Rest per NP/medical resident whose note is outlined above and that I agree with   Mcarthur Rossetti. Tyson Alias, MD, FACP Pgr: [phone] [facility] 05/07/2015 8:42 AM    I have had extensive discussions with family son (representing all children ) and also grandduaghter of daughter. We discussed patients current circumstances and organ failures. We also discussed patient's prior wishes under circumstances such as this. Family has decided to NOT perform resuscitation if arrest but to continue current medical support for now.  Mcarthur Rossetti. Tyson Alias, MD, FACP Pgr: [phone]  Pulmonary & Critical Care

## 2015-05-07 NOTE — Progress Notes (Addendum)
eLink Physician-Brief Progress Note Patient Name: Marcie MowersSarah E Peale DOB: 06/18/1922 MRN: [id number]   Date of Service  05/07/2015  HPI/Events of Note  ABG reviewed. 7.27/46/70/21/92%  eICU Interventions  Increase Fio2 to 50%, Pressure on bipap inreased to 16/8. Repeat ABG     Intervention Category Intermediate Interventions: Other:  Josejuan Hoaglin 05/07/2015, 12:26 AM

## 2015-05-07 NOTE — Progress Notes (Signed)
Pt continuing to refuse to wear BiPAP. Pt is tolerating NRB mask well, vitals all within normal range. BiPAP in room on standby if needed. RT to continue to monitor.

## 2015-05-07 NOTE — Progress Notes (Signed)
~   8PM Paged by RN regarding patient not tolerating BiPAP, pulling it off.  Patient currently on [place]. Went to evaluate patient.  Pt alert and oriented, able to tell me that she feels short of breath.  Denies pain.    VSS, SpO2 upper 90s via [place] [facility]: tachy Lungs:  Diffuse wheezes, bibasilar crackles  Neuro:  Awake, alert, appropriate responses  82 year old woman with hx of COPD, CAD, 3rd degree AV block w/ PPM, dHF, CKD admitted 04/17 with c/o SOB likely 2/2 to AECOPD and acute on chronic dHF.  Patient on BiPAP since admission 04/17 and not tolerating now.  She de-sats w/o resp assistance.  Patient was placed on partial NRB and tolerated well and voiced improvement in dyspnea.   - continue partial NRB, goal SpO2 90-92%  - continue Lasix and Solumedrol (currently due, RN to dose now) - last duoneb was at 8PM, continue scheduled, give prn albuterol neb if still wheezing after solumedrol - plan to try back on BiPAP around 10:30PM or sooner if she becomes confused or not maintaining sat on NRB - as she is calm now on NRB, would try to avoid sedating meds in 82 year old, especially with cardiac hx and current oliguria. - continue close monitoring in ICU - If patient refuses BiPAP again will need to discuss goals of care with patient/family.    Evelena PeatAlex Reis Pienta, DO IMTS PGY3 [phone]

## 2015-05-08 ENCOUNTER — Inpatient Hospital Stay (HOSPITAL_COMMUNITY): Payer: Medicare Other

## 2015-05-08 LAB — GLUCOSE, CAPILLARY
GLUCOSE-CAPILLARY: 134 mg/dL — AB (ref 65–99)
Glucose-Capillary: 128 mg/dL — ABNORMAL HIGH (ref 65–99)

## 2015-05-08 LAB — BASIC METABOLIC PANEL
Anion gap: 16 — ABNORMAL HIGH (ref 5–15)
BUN: 40 mg/dL — ABNORMAL HIGH (ref 6–20)
CALCIUM: 8.6 mg/dL — AB (ref 8.9–10.3)
CO2: 16 mmol/L — AB (ref 22–32)
CREATININE: 1.72 mg/dL — AB (ref 0.44–1.00)
Chloride: 116 mmol/L — ABNORMAL HIGH (ref 101–111)
GFR, EST AFRICAN AMERICAN: 29 mL/min — AB (ref >60)
GFR, EST NON AFRICAN AMERICAN: 25 mL/min — AB (ref >60)
GLUCOSE: 154 mg/dL — AB (ref 65–99)
Potassium: 4.8 mmol/L (ref 3.5–5.1)
Sodium: 148 mmol/L — ABNORMAL HIGH (ref 135–145)

## 2015-05-08 LAB — MAGNESIUM: Magnesium: 2.4 mg/dL (ref 1.7–2.4)

## 2015-05-08 LAB — PHOSPHORUS: Phosphorus: 7.1 mg/dL — ABNORMAL HIGH (ref 2.5–4.6)

## 2015-05-08 MED ORDER — METHYLPREDNISOLONE SODIUM SUCC 40 MG IJ SOLR
40.0000 mg | Freq: Two times a day (BID) | INTRAMUSCULAR | Status: DC
  Filled 2015-05-08: qty 1

## 2015-05-08 MED ORDER — MIDAZOLAM HCL 2 MG/2ML IJ SOLN: 1.0000 mg | INTRAMUSCULAR | Status: DC | PRN

## 2015-05-08 MED ORDER — HALOPERIDOL LACTATE 5 MG/ML IJ SOLN: 1.0000 mg | INTRAMUSCULAR | Status: DC | PRN

## 2015-05-08 MED ORDER — MORPHINE SULFATE (PF) 2 MG/ML IV SOLN
2.0000 mg | Freq: Once | INTRAVENOUS | Status: AC
  Administered 2015-05-08: 2 mg via INTRAVENOUS
  Filled 2015-05-08: qty 1

## 2015-05-08 MED ORDER — FUROSEMIDE 10 MG/ML IJ SOLN
120.0000 mg | Freq: Three times a day (TID) | INTRAVENOUS | Status: DC
  Administered 2015-05-08 (×2): 120 mg via INTRAVENOUS
  Filled 2015-05-08 (×5): qty 12

## 2015-05-08 MED ORDER — MORPHINE SULFATE (PF) 2 MG/ML IV SOLN
1.0000 mg | Freq: Once | INTRAVENOUS | Status: AC
  Administered 2015-05-08: 1 mg via INTRAVENOUS
  Filled 2015-05-08: qty 1

## 2015-05-08 MED ORDER — DEXTROSE 5 % IV SOLN
INTRAVENOUS | Status: DC
  Administered 2015-05-08: 10:00:00 via INTRAVENOUS

## 2015-05-08 MED ORDER — MORPHINE BOLUS VIA INFUSION
5.0000 mg | INTRAVENOUS | Status: DC | PRN
  Administered 2015-05-08: 5 mg via INTRAVENOUS
  Filled 2015-05-08 (×2): qty 20

## 2015-05-08 MED ORDER — DEXTROSE 5 % IV SOLN
10.0000 mg/h | INTRAVENOUS | Status: DC
  Administered 2015-05-08: 10 mg/h via INTRAVENOUS
  Filled 2015-05-08: qty 10

## 2015-05-08 MED ORDER — SODIUM CHLORIDE 0.9 % IV SOLN
10.0000 mg/h | INTRAVENOUS | Status: DC
  Administered 2015-05-08: 2 mg/h via INTRAVENOUS
  Filled 2015-05-08: qty 10

## 2015-05-08 MED ORDER — MORPHINE SULFATE (PF) 4 MG/ML IV SOLN
4.0000 mg | INTRAVENOUS | Status: DC | PRN
Start: 2015-05-08 — End: 2015-05-09

## 2015-05-08 MED ORDER — DEXMEDETOMIDINE HCL IN NACL 200 MCG/50ML IV SOLN
0.4000 ug/kg/h | INTRAVENOUS | Status: DC
  Administered 2015-05-08: 0.4 ug/kg/h via INTRAVENOUS
  Filled 2015-05-08 (×2): qty 50

## 2015-05-08 MED ORDER — MIDAZOLAM BOLUS VIA INFUSION (WITHDRAWAL LIFE SUSTAINING TX)
5.0000 mg | INTRAVENOUS | Status: DC | PRN
  Filled 2015-05-08: qty 20

## 2015-05-08 NOTE — Progress Notes (Signed)
Pt continues to refuse BiPAP, RT explained BiPAP and tried to place on pt after being given morphine. Pt then swatted RT's hands away and said No I dont want that, I want to sleep. Pt is tolerating face tent well at this time. RT will continue to monitor as needed

## 2015-05-08 NOTE — Progress Notes (Signed)
eLink Physician-Brief Progress Note Patient Name: [name] MowersSarah E Eichelberger DOB: 03/19/1922 MRN: [id number]   Date of Service  05/08/2015  HPI/Events of Note  Patient with severe agitation, increased WOb, wheezing, removing bipap and facemask, patient is DNR/DNI, i suggested morphine which has helped  eICU Interventions  Will use morphine as needed,start precedex if needed and bipap,recommend family discussion and discuss comfort care measures     Intervention Category Major Interventions: Change in mental status - evaluation and management  Kavin Weckwerth 05/08/2015, 2:46 AM

## 2015-05-08 NOTE — Progress Notes (Signed)
   05/08/15 1600  Clinical Encounter Type  Visited With Family;Patient and family together  Visit Type Initial;Patient actively dying  Referral From Nurse  Spiritual Encounters  Spiritual Needs Emotional;Grief support;Prayer  Ch responded to consult to offer spiritual support and prayer for pt who is actively dying; pt on comfort measures and transferring.  If additional support is needed, please page on call [facility] [phone].  4:55 PM Erline LevineMichael I Takai Chiaramonte

## 2015-05-08 NOTE — Progress Notes (Addendum)
PULMONARY / CRITICAL CARE MEDICINE   Name: [name] [name] MRN: [id number] DOB: 04/02/1922    ADMISSION DATE:  Aug 13, 2015 CONSULTATION DATE:  08-16-15   REFERRING MD:  Dr. Fayrene FearingJames   CHIEF COMPLAINT:  Respiratory Distress   HISTORY OF PRESENT ILLNESS:   82 y/o F with a PMH of CAD s/p CABG, HTN, HLD, unspecified disorder of AV, DM II, DJD and 2L O2 dependent COPD who [facility] with complaints of SOB and wheezing.    The patient reported on presentation that shortness of breath began evening prior to presentation.  Symptoms worsened and she called EMS.  The patient was treated with IV solu-medrol, atrovent and albuterol in route.  Initial labs - Na 144, K 4.1, Sr Cr 1.45 (appears to be baseline), troponin 0.02, BNP 1639, lactic acid 0.97, glucose 159, WBC 12.4, Hgb 10.2, and platelets 225. CXR negative for acute process but showed changes consistent with COPD.  Initial ABG 7.210 / 64.8 / 175 / 25.  The patient was placed on BiPAP.  Follow up ABG 7.152 / 63 / 67 / 22 (felt to be mixed venous per RT).   At baseline, the patient lives alone.  She has grandchildren that come in to stay with her occasionally.  She also has home health support.  Her granddaughter reports she has been less active in the last 3 days.  She mostly sits in her chair (a hover round).  Requires help with ADL's and obtaining food.  She has also been eating / drinking less in the last three days. Denies known sick contacts, nausea, vomiting, diarrhea, nasal drainage, cough, sputum production, chest pain, pain with inspiration.    Patient currently reports improvement in work of breathing. Reports SOB initially but better on bipap.   PCCM called for admission.   SUBJECTIVE:  Patient very agitated overnight. Refusing to wear BiPAP. Got 2 doses of morphine overnight but minimal improvement in agitation. Had to be started on precedex gtt to improve agitation.   VITAL SIGNS: BP 85/56 mmHg  Pulse 78  Temp(Src)  97.4 F (36.3 C) (Oral)  Resp 19  Wt 100 lb 15.5 oz (45.8 kg)  SpO2 100%  LMP  (LMP Unknown)  HEMODYNAMICS:    VENTILATOR SETTINGS: Vent Mode:  [-] BIPAP FiO2 (%):  [40 %-100 %] 60 % Set Rate:  [15 bmp] 15 bmp PEEP:  [8 cmH20] 8 cmH20 Pressure Support:  [12 cmH20] 12 cmH20  INTAKE / OUTPUT: I/O last 3 completed shifts: In: 717 [I.V.:717] Out: 390 [Urine:390]  PHYSICAL EXAMINATION: General:  Frail elderly female on BiPAP, sedated Neuro:  AAOx4, speech clear, generalized weakness but MAE HEENT:  MM pink/moist, unable to appreciate JVD  Cardiovascular:  s1s2 rrr, no m/r/g, distant tones  Lungs:  Even/non-labored on bipap, prolonged respiratory phase, diffuse wheezing bilaterally  Abdomen:  Soft, non-tender, bsx4 active  Musculoskeletal:  No acute deformities  Skin:  Warm/dry, trace to 1+ BLE pitting edema, changes c/w chronic venous stasis  LABS:  BMET  Recent Labs Lab 05/07/15 1810 05/07/15 2058 05/08/15 0222  NA 146* 146* 148*  K 5.5* 4.5 4.8  CL 115* 115* 116*  CO2 16* 17* 16*  BUN 33* 34* 40*  CREATININE 1.68* 1.72* 1.72*  GLUCOSE 162* 162* 154*    Electrolytes  Recent Labs Lab 05/07/15 1810 05/07/15 2058 05/08/15 0222  CALCIUM 8.5* 8.5* 8.6*  MG  --   --  2.4  PHOS  --   --  7.1*    CBC  Recent Labs Lab 05/08/2015 1110 05/07/15 0219  WBC 12.4* 6.1  HGB 10.2* 9.5*  HCT 34.8* 33.2*  PLT 225 201    Coag's No results for input(s): APTT, INR in the last 168 hours.  Sepsis Markers  Recent Labs Lab 2015/05/08 1122  LATICACIDVEN 0.97    ABG  Recent Labs Lab 05-08-15 2324 05/07/15 0230 05/07/15 0753  PHART 7.268* 7.225* 7.198*  PCO2ART 45.6* 51.1* 50.5*  PO2ART 70.0* 91.0 68.0*    Liver Enzymes No results for input(s): AST, ALT, ALKPHOS, BILITOT, ALBUMIN in the last 168 hours.  Cardiac Enzymes  Recent Labs Lab May 08, 2015 2000 05/07/15 0219 05/07/15 0815  TROPONINI 0.06* 0.16* 0.24*    Glucose  Recent Labs Lab  05/07/15 0342 05/07/15 0809 05/07/15 1131 05/07/15 1558 05/07/15 1944 05/08/15 0358  GLUCAP 139* 112* 124* 131* 148* 128*    Imaging No results found. STUDIES:    CULTURES:   ANTIBIOTICS:   SIGNIFICANT EVENTS: 4/17  Admit with AECOPD   LINES/TUBES:   DISCUSSION: 82 y/o F with PMH of O2 dependent COPD who presented to [facility] on 4/17 with acute onset SOB and hypotension.    ASSESSMENT / PLAN:  PULMONARY A: Acute Respiratory Acidosis  AE-COPD  Baseline 2L O2 Dependent COPD  pulm edema increasing? P:   Discussed DNI yesterday with family, patient refusing BiPAP overnight. Need discussion with family re comfort measures if patient refusing treatement O2 to support saturations 88-95% Solumedrol 60 TID, consider reduction with agitation  Duoneb Q6 with Q3 PRN albuterol  Need neg balance further, see renal If able would want to cycle bipap 4 on 2 off Await am pcxr  CARDIOVASCULAR A:  Hypotension - resolving with IVF, poor PO intake in last few days Hx HTN, HLD, CAD s/p CABG, mild AV outflow tract obstruction, dCHF Troponin leak - likely demand mild Likely chf, diastolic acute? P:  kvo ICU monitoring  DNR Trend BMP  Hold home norvasc, metoprolol ECHO opn order Lasix , see pulm , worsening pcxr c/w edema - increasing  RENAL A:   No acute issues - appears to be at baseline sr cr  Lasix dependent at home pulm edema Hyperphos,m hypernatremia, hyperchloremia P:   Trend BMP  Minimal UOP, Monitor  Replace electrolytes as indicated  Increase lasix bmet in am Add free water drip d5w  GASTROINTESTINAL A:   Protein Calorie Malnutrition  P:   NPO for now with BiPAP use pepcid  HEMATOLOGIC A:   Mild Anemia  P:  Monitor CBC Hold home ferrous sulfate for now Heparin SQ for DVT prophylaxis   INFECTIOUS A:   AECOPD, no fevers, clinical declines P:   Monitor off abx Get pct Low threshold to add levofloxacin  ENDOCRINE A:   DM Mellitus II  Gout   P:   CBG Q4 with SSI  Hold home gout meds Reduce roids  NEUROLOGIC A:   aggitation /resp failure, NIMV need P:   RASS goal: n/a Minimize sedating medications - precedex, low dose NO benzo  FAMILY  - Updates: Jarrod Mcenery update dfmaily    - Inter-disciplinary family meet or Palliative Care meeting due by:  4/24   Valentino Nose, MD IMTS PGY-1 [phone]  STAFF NOTE: I, Rory Percy, MD FACP have personally reviewed patient's available data, including medical history, events of note, physical examination and test results as part of my evaluation. I have discussed with resident/NP and other care providers such as pharmacist, RN and RRT. In  addition, I personally evaluated patient and elicited key findings of: awakens, on NIMV, coarse crackles, some wheezing, awaiot am pcr, clinical status not improved, NIMV if pt will accept 4 on 2 hr off as goal, lasix increase with addition free water d5w, chem in am, may need to progress to comfort care, low threshold CAP coverage although no fevers, get pct, npo remains, steroid reduction with delirium, consider dc precedex and use int fent , low dose haldol, updated granddaughter in full, move to sdu, triad  The patient is critically ill with multiple organ systems failure and requires high complexity decision making for assessment and support, frequent evaluation and titration of therapies, application of advanced monitoring technologies and extensive interpretation of multiple databases.   Critical Care Time devoted to patient care services described in this note is 30 Minutes. This time reflects time of care of this signee: Rory Percy, MD FACP. This critical care time does not reflect procedure time, or teaching time or supervisory time of PA/NP/Med student/Med Resident etc but could involve care discussion time. Rest per NP/medical resident whose note is outlined above and that I agree with   Mcarthur Rossetti. Tyson Alias, MD, FACP Pgr:  [phone] [facility] 05/08/2015 8:30 AM    Further d/w granddaughter and daughter. Confirmed comfort as goal Keep NIIMV until thur when daughter arrives then full comfort care  ,Mcarthur Rossetti. Tyson Alias, MD, FACP Pgr: [phone]  Pulmonary & Critical Care
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
## 2015-05-13 ENCOUNTER — Telehealth: Payer: Self-pay

## 2015-05-13 NOTE — Telephone Encounter (Signed)
On 05/13/2015 I received a death certificate from [employer] (original). The death certificate is for burial. The patient is a patient of Doctor Tyson AliasFeinstein. [facility]. On 05/14/2015 I received the death certificate back from Doctor Tyson AliasFeinstein. I got the death certificate ready and called the funeral home to let them know the death certificate is ready for pickup.

## 2015-05-20 NOTE — Progress Notes (Signed)
At 0340 am pt was noted to have no breathing, no pulse, pt is DNR , comfort care , verified by another RN , Donavan FoilImelda Tuliao, primary physician and family member called to inform of pt's passing.

## 2015-05-20 NOTE — Progress Notes (Signed)
Pt's versed IV , wasted 10 ml on the sink witnessed by Donavan FoilImelda Tuliao, Morphine drip wasted 180 cc on the sink, witnessed by Donavan FoilImelda Tuliao

## 2015-05-20 NOTE — Discharge Summary (Signed)
[name], [name]                 ACCOUNT NO.:  [id number]  MEDICAL RECORD NO.:  [id number]  LOCATION:  [id number]                        FACILITY:  [facility]  PHYSICIAN:  Nelda Bucksaniel J Feinstein, MD DATE OF BIRTH:  1923/01/11  DATE OF ADMISSION:  04/22/2015 DATE OF DISCHARGE:  06/16/2015                              DISCHARGE SUMMARY   DEATH SUMMARY  This was a 82 year old with past medical history of coronary artery disease status post CABG, hyperlipidemia, hypertension, unspecified disorder of AD, diabetes, DJD, 2 L home oxygen-dependent COPD this week, who presented via EMS for shortness of breath.  Symptoms worsened due to steroids, bronchodilators.  Diagnoses included acute respiratory acidosis, COPD exacerbation, pulmonary edema worsening with increasing concerns of the heart failure.  Discussion was made with the family, she was declining, she was failing BiPAP, and the patient was made DNR/DNI. Comfort care was initiated once daughter __________ discharged and the patient expired.  FINAL DIAGNOSES UPON DEATH: 1. Heart failure exacerbation, presumed diastolic, acute. 2. Acute respiratory failure. 3. Acute respiratory acidosis. 4. Chronic obstructive pulmonary disease with exacerbation. 5. Pulmonary edema secondary to heart failure.     Nelda Bucksaniel J Feinstein, MD     DJF/MEDQ  D:  05/15/2015  T:  05/16/2015  Job:  [id number]

## 2015-05-20 DEATH — deceased

## 2016-12-15 IMAGING — CR DG CHEST 2V
2 series · 2 of 2 positions shown · non-contrast
Comparison: Chest radiograph March 06, 2015

CLINICAL DATA: Fell this morning, forehead hematoma. Shortness of
breath. History of COPD.

EXAM:
CHEST  2 VIEW

[chest lat]
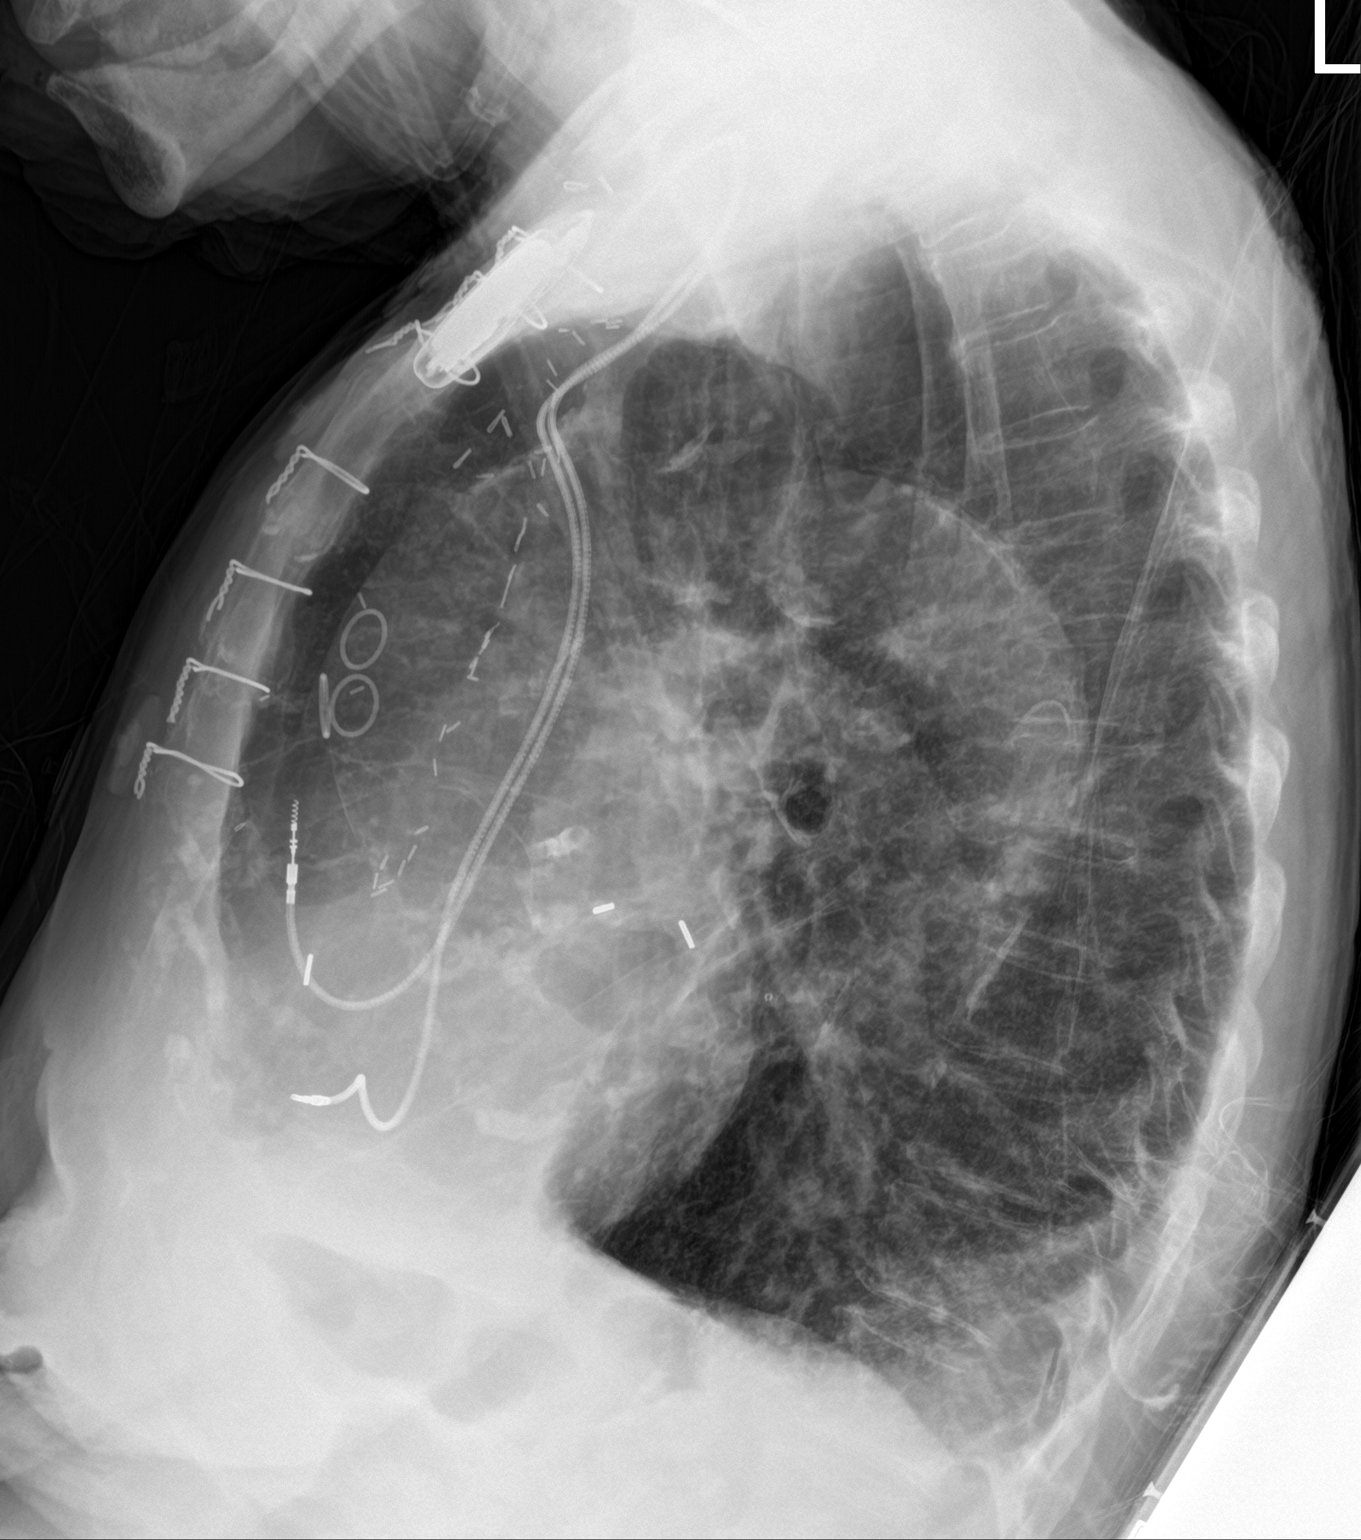

[chest ap]
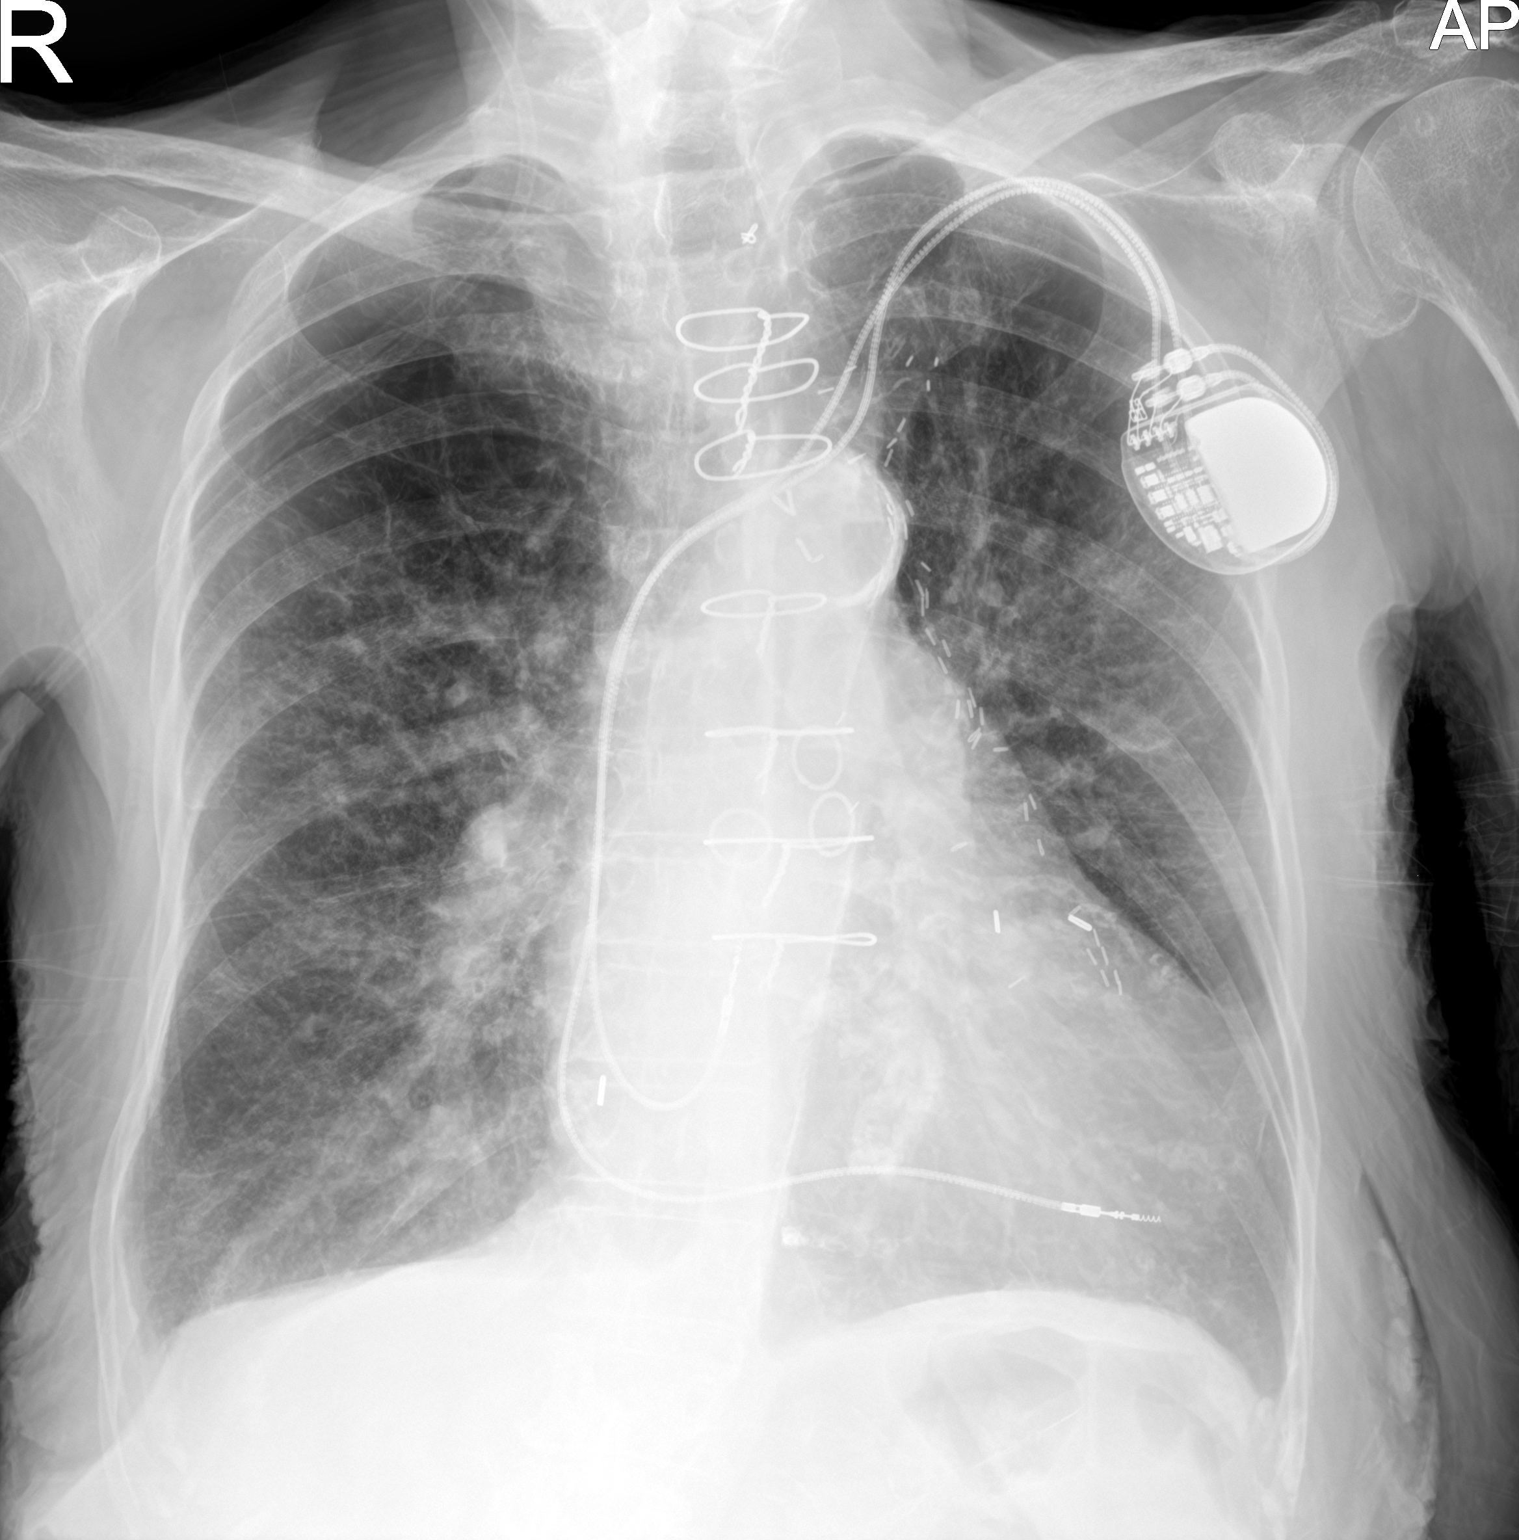

[2 of 2 positions shown; findings below may reference images not displayed]

FINDINGS: The cardiac silhouette is moderately enlarged, unchanged. Status
post median sternotomy for CABG. Calcified aortic knob. Similar
chronic interstitial changes and increased lung volumes compatible
with COPD. Improved aeration RIGHT midlung zone. No pleural
effusion. No pneumothorax. Dual lead LEFT cardiac pacemaker in situ.
Osteopenia. Soft tissue planes are nonsuspicious.
IMPRESSION: Stable cardiomegaly and COPD.  Improved aeration RIGHT midlung zone.

## 2016-12-15 IMAGING — CT CT CERVICAL SPINE W/O CM
4 of 5 series · 15 of 33 positions shown, 18 images · non-contrast
Comparison: 02/16/2013

CLINICAL DATA: Patient fell this morning with large hematoma to
right forehead, no loss of consciousness

EXAM:
CT HEAD WITHOUT CONTRAST
CT CERVICAL SPINE WITHOUT CONTRAST
TECHNIQUE: Multidetector CT imaging of the head and cervical spine was
performed following the standard protocol without intravenous
contrast. Multiplanar CT image reconstructions of the cervical spine
were also generated.

[Series 202: head w/o bone, idose (1) · axial · non-contrast · 0.45mm/px · z∈[+218,+253]mm · 2 of 58 slices shown]
[im 15/58  bone]
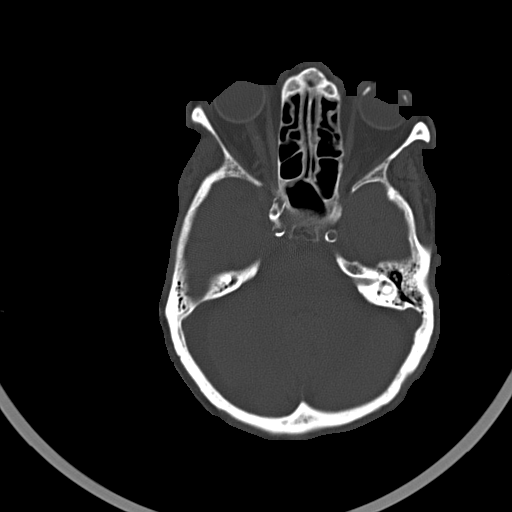
[im 29/58  bone]
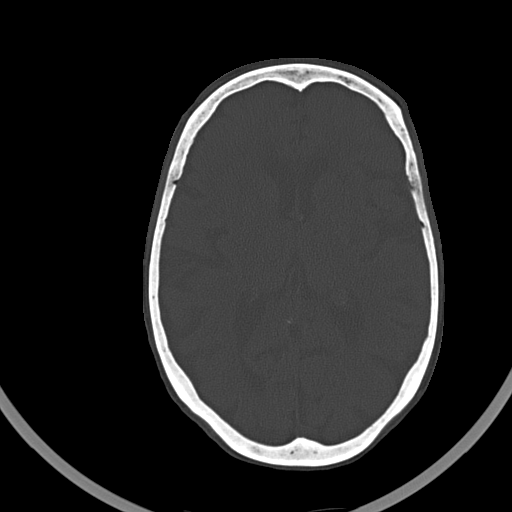

[Series 302: soft tissue, idose (2) · axial · 0.39mm/px · z∈[+81,+185]mm · 5 of 80 slices shown, 7 images]
[im 14/80  soft-tissue]
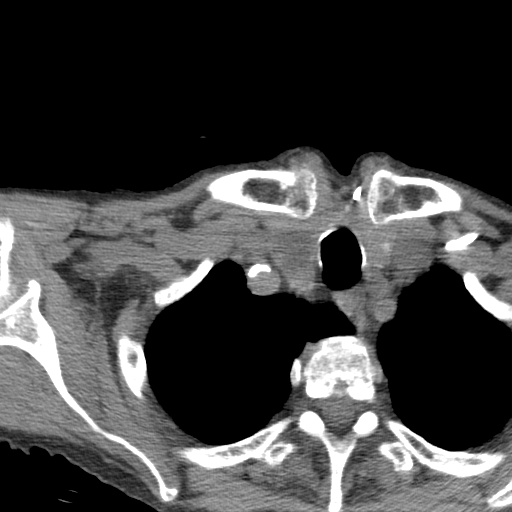
[im 14/80  bone]
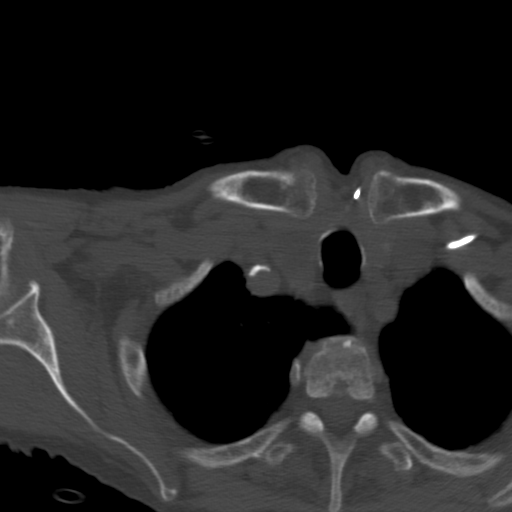
[im 27/80  bone]
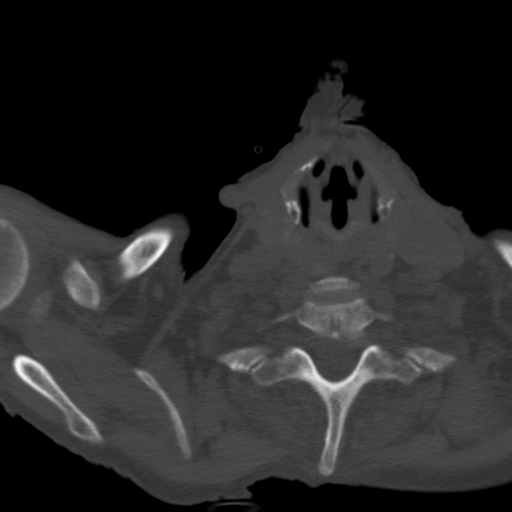
[im 40/80  bone]
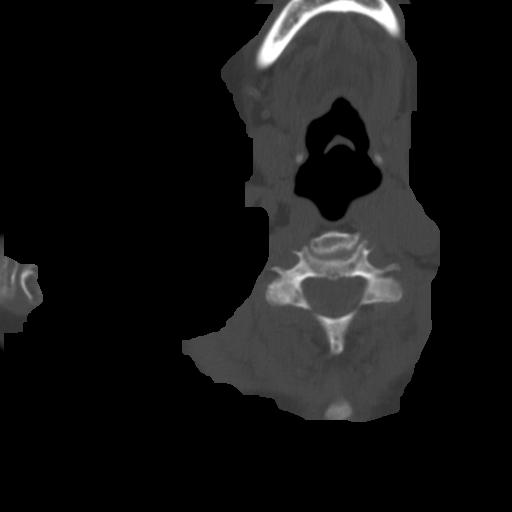
[im 53/80  bone]
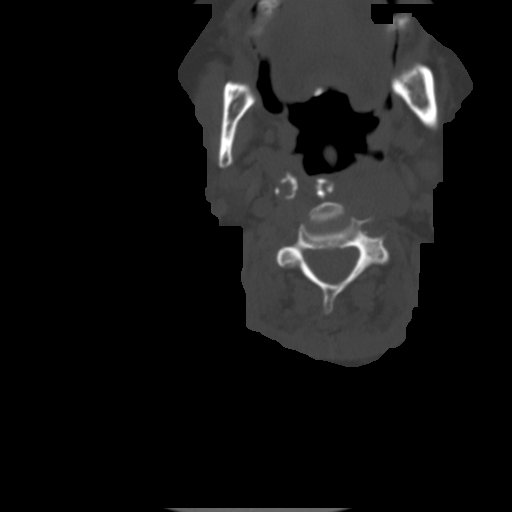
[im 66/80  soft-tissue]
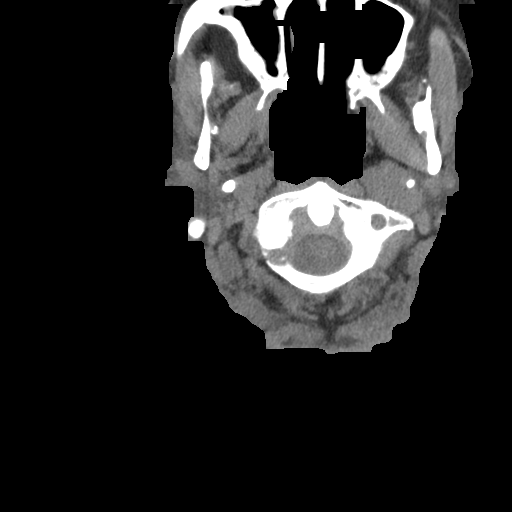
[im 66/80  bone]
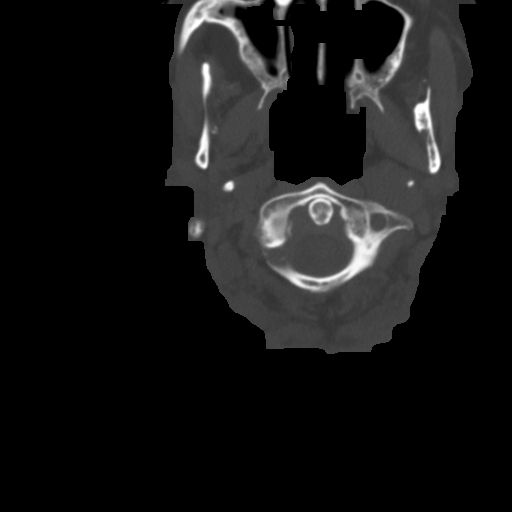

[Series 304: coronal, idose (2) · coronal · 0.33mm/px · 3 of 100 slices shown]
[im 20/100  bone]
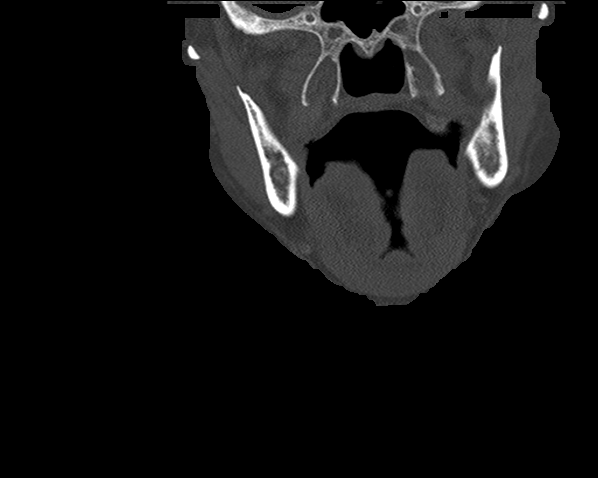
[im 40/100  bone]
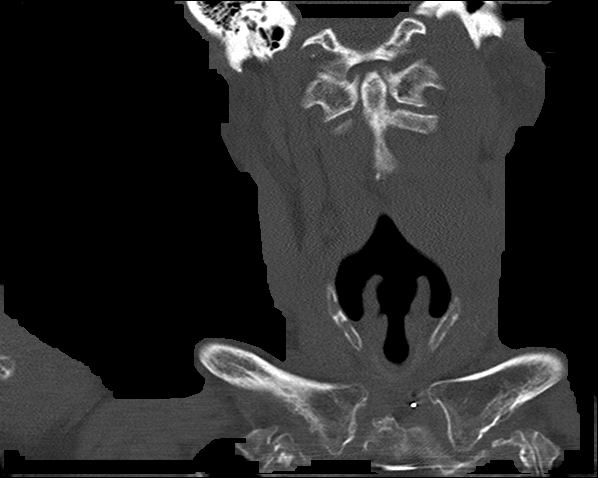
[im 60/100  bone]
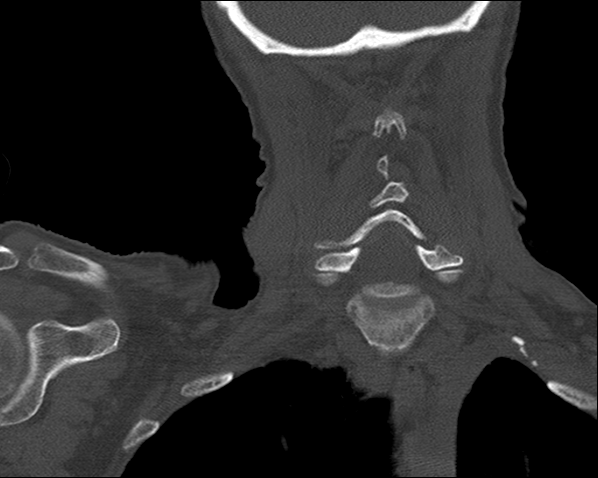

[Series 305: sagittal, idose (2) · sagittal · 0.33mm/px · 5 of 100 slices shown, 6 images]
[im 34/100  bone]
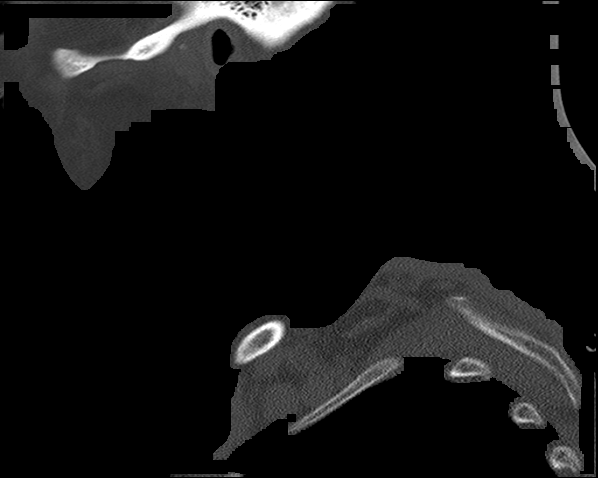
[im 42/100  bone]
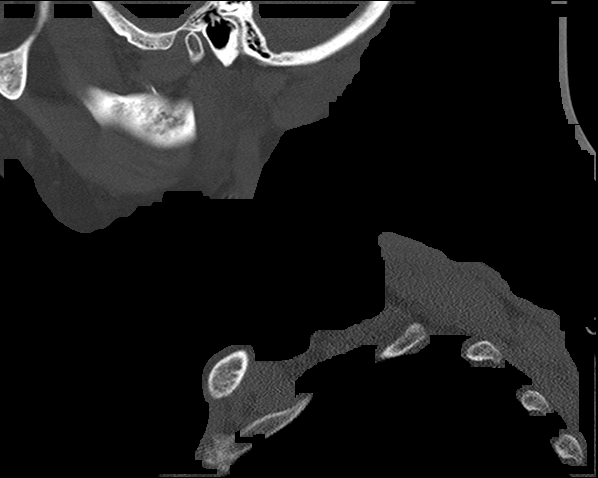
[im 50/100  soft-tissue]
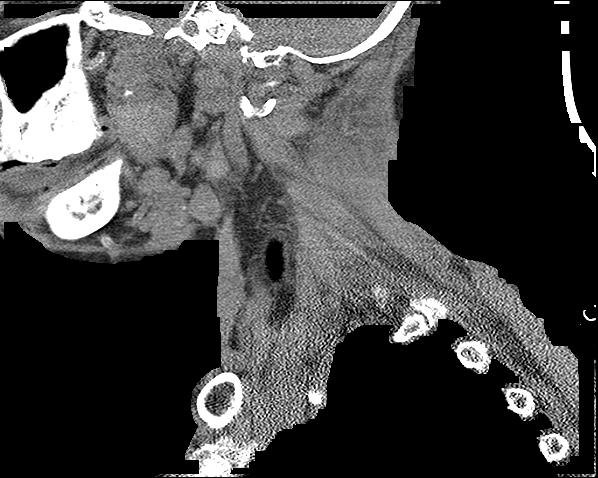
[im 50/100  bone]
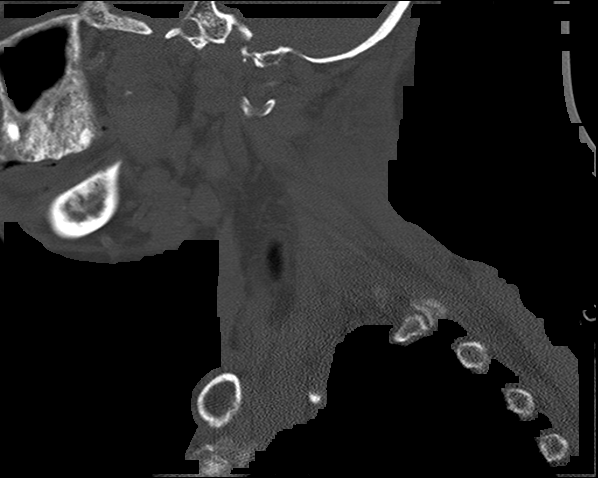
[im 58/100  bone]
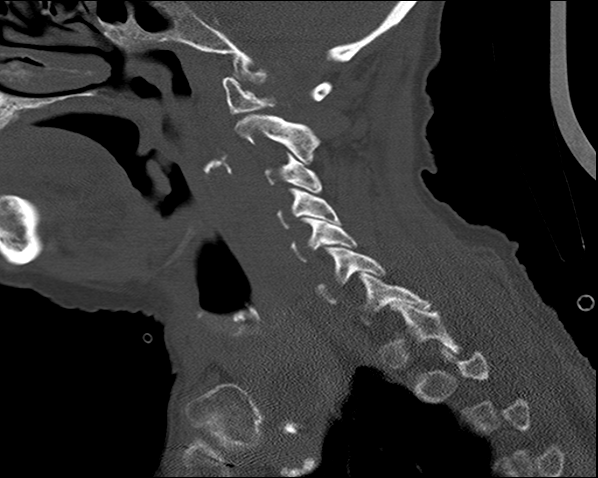
[im 67/100  bone]
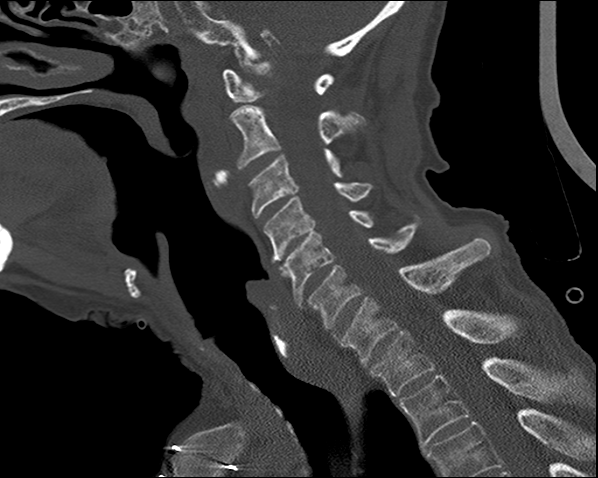

[15 of 33 positions shown; findings below may reference images not displayed]

FINDINGS: CT HEAD FINDINGS

Consistent with clinical history there is a large right scalp
hematoma anteriorly on the right near the vertex. No intracranial
hemorrhage or extra-axial fluid. No infarct mass or hydrocephalus.
Diffuse atrophy and low attenuation in the deep white matter stable.
Calvarium intact with no evidence of skull fracture.

CT CERVICAL SPINE FINDINGS

Tortuous and heavily calcified carotid arteries. No acute soft
tissue abnormalities. There is distention of the left jugular vein.
There are numerous thyroid nodules largest measuring about 12 mm.
Emphysematous changes seen in the lung apices.

Normal alignment with no prevertebral soft tissue swelling.
Degenerative disc disease of moderate severity at C4-5 and C5-6 with
mild C6-7 and C7-T1 degenerative disc disease. There is
approximately 33% compression deformity of C7 with no significant
retropulsion. No acute fracture line identified.
IMPRESSION: 1. Scalp hematoma with no acute intracranial abnormality
2. No evidence of acute cervical spine fracture. Mild C7 compression
deformity shows an a chronic appearance although no prior images of
this area are available for comparison.

## 2016-12-29 IMAGING — CR DG CHEST 1V PORT
1 series · 1 of 1 positions shown · non-contrast
Comparison: 04/22/2015

CLINICAL DATA: Shortness of Breath

EXAM:
PORTABLE CHEST 1 VIEW

[AP]
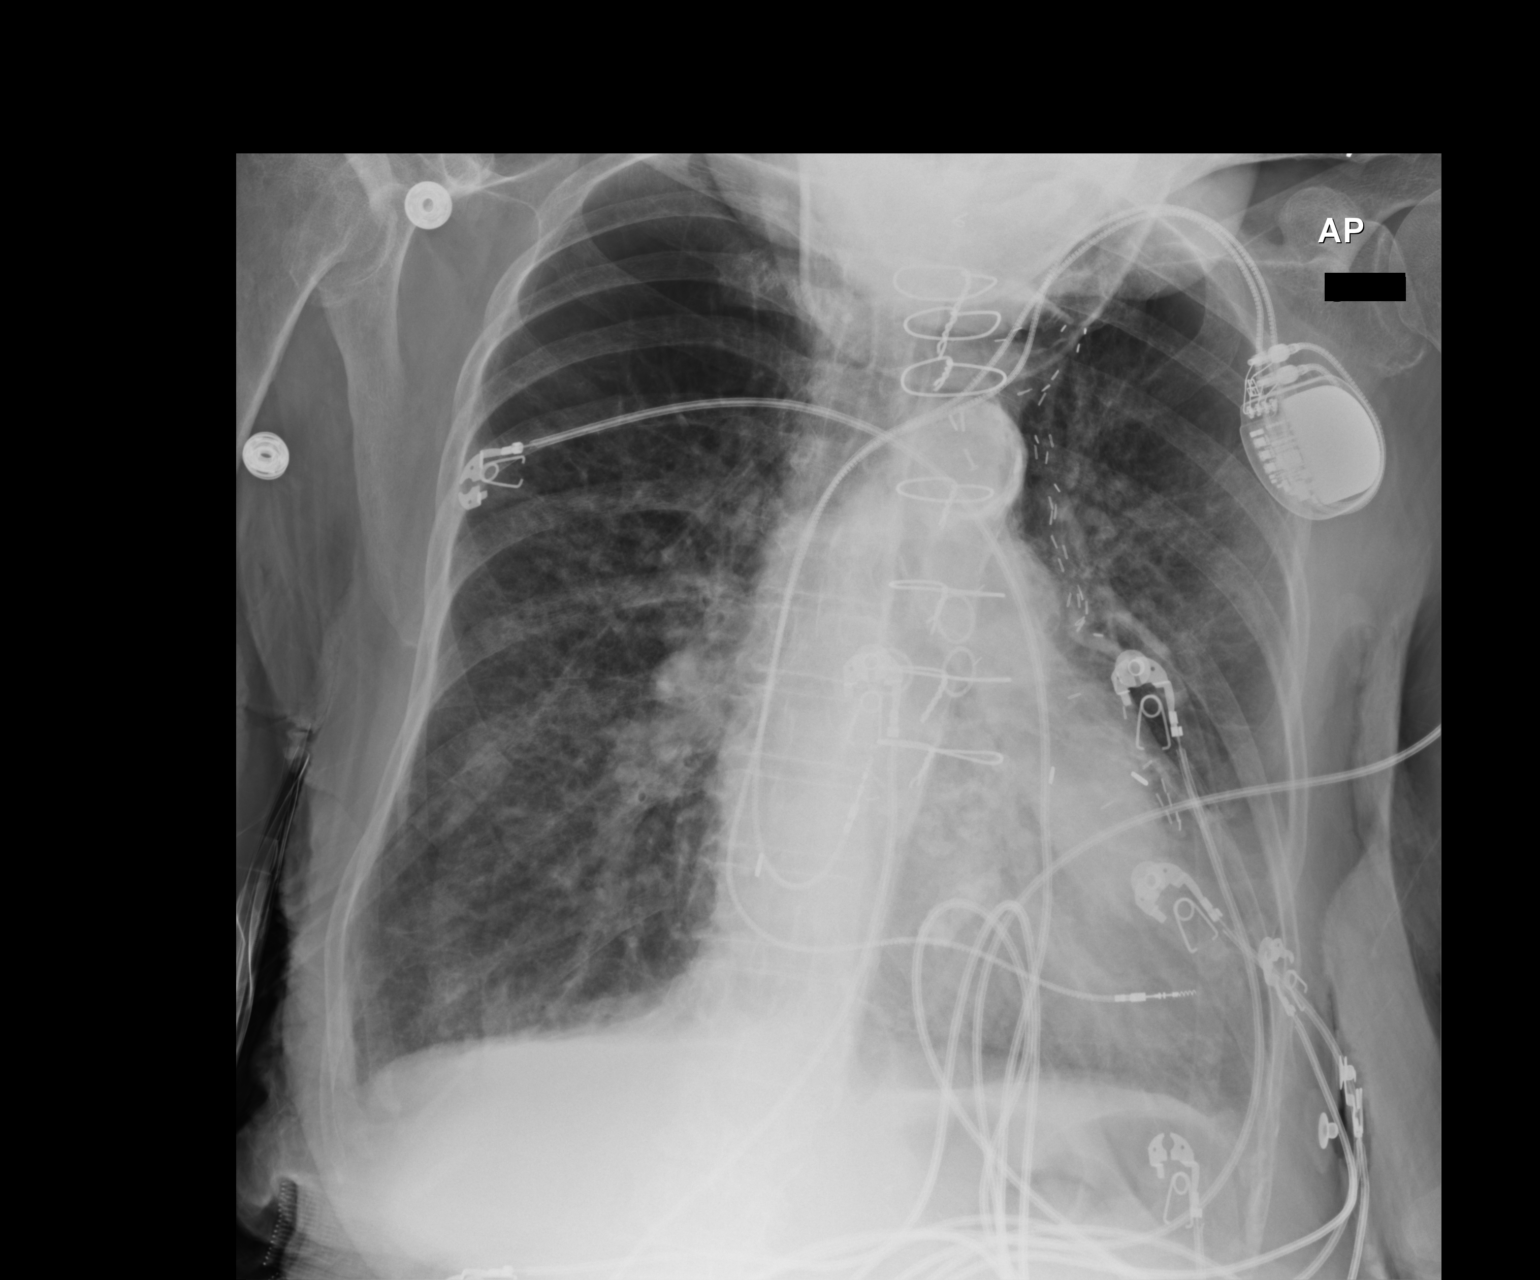

[1 of 1 positions shown; findings below may reference images not displayed]

FINDINGS: Cardiac shadow is stable. Pacing device is again seen. Postsurgical
changes are again noted. The lungs are well aerated bilaterally in
demonstrates some decreased markings in the apices consistent with
COPD. Stable bilateral interstitial changes are again noted. No
focal confluent infiltrate is seen. A sizable effusion is noted.
IMPRESSION: Stable changes similar to that seen on the prior exam.

COPD.

No acute abnormality is noted.
